# Patient Record
Sex: Female | Born: 1990 | Race: Black or African American | Hispanic: No | Marital: Single | Smoking: Current every day smoker
Health system: Southern US, Community
[De-identification: ages and names within clinical notes are randomized; demographics above are authoritative.]

## PROBLEM LIST (undated history)

## (undated) ENCOUNTER — Inpatient Hospital Stay (HOSPITAL_COMMUNITY): Payer: Self-pay

## (undated) ENCOUNTER — Emergency Department (HOSPITAL_COMMUNITY): Admission: EM | Payer: Medicaid Other | Source: Home / Self Care

## (undated) DIAGNOSIS — T7840XA Allergy, unspecified, initial encounter: Secondary | ICD-10-CM

## (undated) DIAGNOSIS — R569 Unspecified convulsions: Secondary | ICD-10-CM

## (undated) DIAGNOSIS — J301 Allergic rhinitis due to pollen: Secondary | ICD-10-CM

## (undated) DIAGNOSIS — Z87898 Personal history of other specified conditions: Secondary | ICD-10-CM

## (undated) DIAGNOSIS — Z8669 Personal history of other diseases of the nervous system and sense organs: Secondary | ICD-10-CM

## (undated) DIAGNOSIS — F32A Depression, unspecified: Secondary | ICD-10-CM

## (undated) DIAGNOSIS — E236 Other disorders of pituitary gland: Secondary | ICD-10-CM

## (undated) DIAGNOSIS — IMO0002 Reserved for concepts with insufficient information to code with codable children: Secondary | ICD-10-CM

## (undated) DIAGNOSIS — F329 Major depressive disorder, single episode, unspecified: Secondary | ICD-10-CM

## (undated) DIAGNOSIS — G43909 Migraine, unspecified, not intractable, without status migrainosus: Secondary | ICD-10-CM

## (undated) HISTORY — DX: Allergic rhinitis due to pollen: J30.1

## (undated) HISTORY — DX: Major depressive disorder, single episode, unspecified: F32.9

## (undated) HISTORY — DX: Allergy, unspecified, initial encounter: T78.40XA

## (undated) HISTORY — DX: Migraine, unspecified, not intractable, without status migrainosus: G43.909

## (undated) HISTORY — DX: Reserved for concepts with insufficient information to code with codable children: IMO0002

## (undated) HISTORY — DX: Depression, unspecified: F32.A

## (undated) HISTORY — PX: APPENDECTOMY: SHX54

## (undated) HISTORY — DX: Personal history of other diseases of the nervous system and sense organs: Z86.69

---
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
## 2012-03-03 ENCOUNTER — Other Ambulatory Visit: Payer: Self-pay | Admitting: Family Medicine

## 2012-03-05 NOTE — Telephone Encounter (Signed)
Electronic refill request.  Please advise. 

## 2012-03-06 NOTE — Telephone Encounter (Signed)
Clarify what dose pt no and fix med list. Refill until she makes an appt for yearly visit.

## 2012-03-07 NOTE — Telephone Encounter (Signed)
Spoke with father, pt no longer lives her, she moved back to [place].  Medicine denied to pharmacy.

## 2012-03-08 ENCOUNTER — Other Ambulatory Visit: Payer: Self-pay | Admitting: *Deleted

## 2012-03-09 MED ORDER — SERTRALINE HCL 50 MG PO TABS: 50.0000 mg | ORAL_TABLET | Freq: Every day | ORAL | Status: DC

## 2012-03-09 NOTE — Telephone Encounter (Signed)
Needs to make appt for CPX/ med follow up

## 2012-03-09 NOTE — Telephone Encounter (Signed)
Left message on VM that patient needs to schedule CPX

## 2012-12-19 ENCOUNTER — Telehealth: Payer: Self-pay

## 2012-12-19 NOTE — Telephone Encounter (Signed)
Pt left v/m that she is entering [employer] and needs a note that her eczema will not spread when she perspires.Please advise.

## 2012-12-20 ENCOUNTER — Encounter: Payer: Self-pay | Admitting: *Deleted

## 2012-12-20 NOTE — Telephone Encounter (Signed)
On your desk. Signed.

## 2012-12-20 NOTE — Telephone Encounter (Signed)
Letter sent to you for edit

## 2012-12-20 NOTE — Telephone Encounter (Signed)
Agreed. Please write note and I will sign.

## 2012-12-20 NOTE — Telephone Encounter (Signed)
Letter mailed to patient per patient request.

## 2013-03-19 ENCOUNTER — Emergency Department (INDEPENDENT_AMBULATORY_CARE_PROVIDER_SITE_OTHER)
Admission: EM | Admit: 2013-03-19 | Discharge: 2013-03-19 | Disposition: A | Discharge status: Home / Self Care | Payer: Self-pay | Source: Home / Self Care

## 2013-03-19 ENCOUNTER — Encounter (HOSPITAL_COMMUNITY): Payer: Self-pay | Admitting: Emergency Medicine

## 2013-03-19 DIAGNOSIS — N6452 Nipple discharge: Secondary | ICD-10-CM

## 2013-03-19 DIAGNOSIS — N6459 Other signs and symptoms in breast: Secondary | ICD-10-CM

## 2013-03-19 MED ORDER — CEPHALEXIN 500 MG PO CAPS: 500.0000 mg | ORAL_CAPSULE | Freq: Four times a day (QID) | ORAL | Status: DC

## 2013-03-19 NOTE — ED Provider Notes (Signed)
Medical screening examination/treatment/procedure(s) were performed by resident physician or non-physician practitioner and as supervising physician I was immediately available for consultation/collaboration.   Barkley Bruns MD.   Linna Hoff, MD 03/19/13 1415

## 2013-03-19 NOTE — ED Notes (Signed)
Pt c/o right nipple/breast d/c onset last night The d/c is blood and when she applies enough pressure it starts to bleed?? Sxs also include: itchiness and pain 5/10 Denies: fevers, inj/trauma Alert w/no signs of acute distress.

## 2013-03-19 NOTE — ED Provider Notes (Signed)
CSN: [id number]     Arrival date & time 03/19/13  1100 History   First MD Initiated Contact with Patient 03/19/13 1154     Chief Complaint  Patient presents with  . Breast Discharge   (Consider location/radiation/quality/duration/timing/severity/associated sxs/prior Treatment) HPI Comments: 22 year old female complaining of bleeding from the right nipple since last night. She noticed something wet on her breasts last night but down and saw there was a small amount of blood. She expressed her breast and noticed that a small amount would exude from the tip of the nipple. There is an area to the right side of the breast with minor tenderness. Otherwise she does not have pain. The left breast is completely unaffected and asymptomatic. She denies systemic symptoms.    Past Medical History  Diagnosis Date  . Allergy   . Depression   . Migraines    Past Surgical History  Procedure Laterality Date  . Appendectomy     Family History  Problem Relation Age of Onset  . Adopted: Yes   History  Substance Use Topics  . Smoking status: Never Smoker   . Smokeless tobacco: Not on file  . Alcohol Use: No   OB History   Grav Para Term Preterm Abortions TAB SAB Ect Mult Living                 Review of Systems  Constitutional: Negative.   HENT: Negative.   Respiratory: Negative.   Cardiovascular: Negative.   Gastrointestinal: Negative.   Genitourinary: Negative.   Skin: Negative.   Neurological: Negative.     Allergies  Review of patient's allergies indicates no known allergies.  Home Medications   Current Outpatient Rx  Name  Route  Sig  Dispense  Refill  . cephALEXin (KEFLEX) 500 MG capsule   Oral   Take 1 capsule (500 mg total) by mouth 4 (four) times daily.   28 capsule   0   . sertraline (ZOLOFT) 50 MG tablet   Oral   Take 1 tablet (50 mg total) by mouth daily.   30 tablet   1    BP 113/78  Pulse 63  Temp(Src) 98.9 F (37.2 C) (Oral)  Resp 18  SpO2 99%  LMP  02/23/2013 Physical Exam  Nursing note and vitals reviewed. Constitutional: She is oriented to person, place, and time. She appears well-developed and well-nourished. No distress.  Eyes: EOM are normal.  Neck: Normal range of motion. Neck supple.  Cardiovascular: Normal rate.   Pulmonary/Chest: Effort normal. No respiratory distress.  Breasts. Bilateral breast are  Symmetric. Her breast exam reveals no visible polyps bumps or anemia contours. Areola round, there is no discoloration. Palpation to the right lateral aspect of the breast produces mild tenderness. No palpable lumps or bumps or thickening. No thickening or cystic palpations beneath the areola. Manual expression of the breast near the nipple produces thin, scant bloody discharge.   Musculoskeletal: She exhibits no edema.  Neurological: She is alert and oriented to person, place, and time. She exhibits normal muscle tone.  Skin: Skin is warm and dry.  Psychiatric: She has a normal mood and affect.    ED Course  Procedures (including critical care time) Labs Review Labs Reviewed - No data to display Imaging Review No results found.    MDM   1. Bloody discharge from right nipple      Scant amount of discharge is produced with manipulation of the right breast near the Arreola. No purulence. Differential includes  ductal papilloma or memory.ectasia. Patient is to see her PCP and call today for followup appointment suspect she will need additional tests possibly cytology or ultrasound.  Culture sent.  Hayden Rasmussen, NP 03/19/13 1309

## 2013-03-19 NOTE — ED Notes (Signed)
At bedside during breast exam

## 2013-03-22 LAB — CULTURE, ROUTINE-ABSCESS

## 2013-03-22 NOTE — ED Notes (Addendum)
Abscess culture: Few staph (coagulase neg.).  Pt. adequately treated with Keflex. Candace Barnes 03/22/2013

## 2013-09-29 ENCOUNTER — Encounter (HOSPITAL_COMMUNITY): Payer: Self-pay | Admitting: Emergency Medicine

## 2013-09-29 ENCOUNTER — Emergency Department (HOSPITAL_COMMUNITY)
Admission: EM | Admit: 2013-09-29 | Discharge: 2013-09-29 | Disposition: A | Discharge status: Home / Self Care | Payer: BC Managed Care – PPO | Attending: Emergency Medicine | Admitting: Emergency Medicine

## 2013-09-29 DIAGNOSIS — Z8639 Personal history of other endocrine, nutritional and metabolic disease: Secondary | ICD-10-CM | POA: Insufficient documentation

## 2013-09-29 DIAGNOSIS — R519 Headache, unspecified: Secondary | ICD-10-CM

## 2013-09-29 DIAGNOSIS — R51 Headache: Secondary | ICD-10-CM | POA: Insufficient documentation

## 2013-09-29 DIAGNOSIS — Z8659 Personal history of other mental and behavioral disorders: Secondary | ICD-10-CM | POA: Insufficient documentation

## 2013-09-29 DIAGNOSIS — Z8679 Personal history of other diseases of the circulatory system: Secondary | ICD-10-CM | POA: Insufficient documentation

## 2013-09-29 DIAGNOSIS — Z862 Personal history of diseases of the blood and blood-forming organs and certain disorders involving the immune mechanism: Secondary | ICD-10-CM | POA: Insufficient documentation

## 2013-09-29 LAB — POC URINE PREG, ED: Preg Test, Ur: NEGATIVE

## 2013-09-29 MED ORDER — KETOROLAC TROMETHAMINE 30 MG/ML IJ SOLN
30.0000 mg | Freq: Once | INTRAMUSCULAR | Status: AC
  Administered 2013-09-29: 30 mg via INTRAVENOUS
  Filled 2013-09-29: qty 1

## 2013-09-29 MED ORDER — SODIUM CHLORIDE 0.9 % IV BOLUS (SEPSIS)
1000.0000 mL | Freq: Once | INTRAVENOUS | Status: AC
Start: 2013-09-29 — End: 2013-09-29
  Administered 2013-09-29: 1000 mL via INTRAVENOUS

## 2013-09-29 MED ORDER — METOCLOPRAMIDE HCL 5 MG/ML IJ SOLN
10.0000 mg | Freq: Once | INTRAMUSCULAR | Status: AC
  Administered 2013-09-29: 10 mg via INTRAVENOUS
  Filled 2013-09-29: qty 2

## 2013-09-29 MED ORDER — DIPHENHYDRAMINE HCL 50 MG/ML IJ SOLN
25.0000 mg | Freq: Once | INTRAMUSCULAR | Status: AC
  Administered 2013-09-29: 25 mg via INTRAVENOUS
  Filled 2013-09-29: qty 1

## 2013-09-29 NOTE — ED Notes (Signed)
Pt. reports migraine headache onset this evening unrelieved by Ibuprofen , denies nausea/vomitting or blurred vision .

## 2013-09-29 NOTE — ED Provider Notes (Signed)
CSN: [id number]     Arrival date & time 09/29/13  1947 History   First MD Initiated Contact with Patient 09/29/13 2128     Chief Complaint  Patient presents with  . Migraine     (Consider location/radiation/quality/duration/timing/severity/associated sxs/prior Treatment) HPI  23 year old female with history of migraine who presents complaining of headache. 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 She was placed on a medication which she took for several months. While taking the medication these sharp pain ceased. She discontinued the medication on her own and subsequently report having recurrent sharp electric-like pain to the back of her head on a fairly regular occurrence. The pain sometimes induce headache. She is now endorse  acute onset of throbbing  headache to the left temporal region for the past 6 hours. Pain intensified but has slowly improved. However pain has not resolved despite taking 200 mg of ibuprofen. She denies having any fever, chills, double vision, nausea vomiting diarrhea, light or sound sensitivity, neck stiffness, rash, numbness or weakness. She request for another head MRI to assess her pituitary gland. She does have a primary care Dr. but has not seen for this recently.  Past Medical History  Diagnosis Date  . Allergy   . Depression   . Migraines    Past Surgical History  Procedure Laterality Date  . Appendectomy     Family History  Problem Relation Age of Onset  . Adopted: Yes   History  Substance Use Topics  . Smoking status: Never Smoker   . Smokeless tobacco: Not on file  . Alcohol Use: No   OB History   Grav Para Term Preterm Abortions TAB SAB Ect Mult Living                 Review of Systems  All other systems reviewed and are negative.     Allergies  Other  Home Medications   Prior to Admission medications    Medication Sig Start Date End Date Taking? Authorizing Provider  ibuprofen (ADVIL,MOTRIN) 200 MG tablet Take 200 mg by mouth every 6 (six) hours as needed for headache.   Yes Historical Provider, MD   BP 116/77  Pulse 68  Temp(Src) 99.1 F (37.3 C) (Oral)  Resp 14  Ht 6\' 1"  (1.854 m)  Wt 141 lb (63.957 kg)  BMI 18.61 kg/m2  SpO2 99% Physical Exam  Nursing note and vitals reviewed. Constitutional: She is oriented to person, place, and time. She appears well-developed and well-nourished. No distress.  HENT:  Head: Normocephalic and atraumatic.  Right Ear: External ear normal.  Left Ear: External ear normal.  Nose: Nose normal.  Mouth/Throat: Oropharynx is clear and moist. No oropharyngeal exudate.  Eyes: Conjunctivae and EOM are normal. Pupils are equal, round, and reactive to light.  Neck: Normal range of motion. Neck supple. No thyromegaly present.  No nuchal rigidity  Cardiovascular: Normal rate and regular rhythm.   Pulmonary/Chest: Effort normal and breath sounds normal.  Musculoskeletal: Normal range of motion. She exhibits no edema.  Lymphadenopathy:    She has no cervical adenopathy.  Neurological: She is alert and oriented to person, place, and time. She has normal strength. No cranial nerve deficit or sensory deficit. She displays a negative Romberg sign. Coordination and gait normal. GCS eye subscore is 4. GCS verbal subscore is 5. GCS motor subscore is 6.  Skin: No rash noted.  Psychiatric: She has a normal mood and affect.    ED Course  Procedures (including critical care time)  9:41 PM Patient here with complaint of headache. The primary reason to be seen was to have an MRI to assess for pituitary gland. She has no red flags. Have low suspicion for subarachnoid hemorrhage, stroke, or meningitis. She has no loss of vision numbness or weakness to suggest acute pituitary problem. She may benefit from an MRI outpatient to her PCP if deemed appropriate. Plan to give  patient migraine cocktail and will continue to monitor.  11:49 PM Patient report her headache has completely resolved. She feels comfortable going Home. She will followup with her PCP for further management. Return precautions discussed.    Labs Review Labs Reviewed - No data to display  Imaging Review No results found.   EKG Interpretation None      MDM   Final diagnoses:  Headache    BP 101/50  Pulse 69  Temp(Src) 99.1 F (37.3 C) (Oral)  Resp 14  Ht 6\' 1"  (1.854 m)  Wt 141 lb (63.957 kg)  BMI 18.61 kg/m2  SpO2 99%     Domenic Moras, PA-C 09/29/13 2350

## 2013-09-29 NOTE — Discharge Instructions (Signed)

## 2013-09-30 NOTE — ED Provider Notes (Signed)
Medical screening examination/treatment/procedure(s) were performed by non-physician practitioner and as supervising physician I was immediately available for consultation/collaboration.   EKG Interpretation None      Rolland Porter, MD, Abram Sander   Janice Norrie, MD 09/30/13 [phone]

## 2013-11-01 ENCOUNTER — Emergency Department: Payer: Self-pay | Admitting: Emergency Medicine

## 2013-11-01 LAB — URINALYSIS, COMPLETE
Bilirubin,UR: NEGATIVE
GLUCOSE, UR: NEGATIVE mg/dL (ref 0–75)
Ketone: NEGATIVE
Nitrite: POSITIVE
Ph: 7 (ref 4.5–8.0)
Protein: 30
SPECIFIC GRAVITY: 1.02 (ref 1.003–1.030)
Squamous Epithelial: 7
WBC UR: 49 /HPF (ref 0–5)

## 2013-11-01 LAB — BASIC METABOLIC PANEL
Anion Gap: 1 — ABNORMAL LOW (ref 7–16)
BUN: 15 mg/dL (ref 7–18)
CO2: 29 mmol/L (ref 21–32)
Calcium, Total: 9 mg/dL (ref 8.5–10.1)
Chloride: 106 mmol/L (ref 98–107)
Creatinine: 0.89 mg/dL (ref 0.60–1.30)
EGFR (Non-African Amer.): >60
Glucose: 87 mg/dL (ref 65–99)
OSMOLALITY: 272 (ref 275–301)
Potassium: 3.9 mmol/L (ref 3.5–5.1)
SODIUM: 136 mmol/L (ref 136–145)

## 2013-11-01 LAB — CBC WITH DIFFERENTIAL/PLATELET
Basophil #: 0 10*3/uL (ref 0.0–0.1)
Basophil %: 0.8 %
Eosinophil #: 0.2 10*3/uL (ref 0.0–0.7)
Eosinophil %: 4.1 %
HCT: 36.8 % (ref 35.0–47.0)
HGB: 11.2 g/dL — ABNORMAL LOW (ref 12.0–16.0)
LYMPHS ABS: 2.1 10*3/uL (ref 1.0–3.6)
LYMPHS PCT: 54.4 %
MCH: 19.9 pg — ABNORMAL LOW (ref 26.0–34.0)
MCHC: 30.6 g/dL — ABNORMAL LOW (ref 32.0–36.0)
MCV: 65 fL — ABNORMAL LOW (ref 80–100)
Monocyte #: 0.3 x10 3/mm (ref 0.2–0.9)
Monocyte %: 8.2 %
Neutrophil #: 1.3 10*3/uL — ABNORMAL LOW (ref 1.4–6.5)
Neutrophil %: 32.5 %
PLATELETS: 238 10*3/uL (ref 150–440)
RBC: 5.65 10*6/uL — ABNORMAL HIGH (ref 3.80–5.20)
RDW: 21.2 % — ABNORMAL HIGH (ref 11.5–14.5)
WBC: 3.9 10*3/uL (ref 3.6–11.0)

## 2014-02-14 ENCOUNTER — Emergency Department (HOSPITAL_COMMUNITY): Admission: EM | Admit: 2014-02-14 | Discharge: 2014-02-14 | Discharge status: Against Medical Advice | Payer: Self-pay
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
## 2014-05-03 ENCOUNTER — Encounter (HOSPITAL_COMMUNITY): Payer: Self-pay | Admitting: *Deleted

## 2014-05-03 ENCOUNTER — Emergency Department (HOSPITAL_COMMUNITY)
Admission: EM | Admit: 2014-05-03 | Discharge: 2014-05-03 | Disposition: A | Discharge status: Home / Self Care | Payer: BC Managed Care – PPO | Attending: Emergency Medicine | Admitting: Emergency Medicine

## 2014-05-03 DIAGNOSIS — Z8659 Personal history of other mental and behavioral disorders: Secondary | ICD-10-CM | POA: Insufficient documentation

## 2014-05-03 DIAGNOSIS — H9209 Otalgia, unspecified ear: Secondary | ICD-10-CM | POA: Insufficient documentation

## 2014-05-03 DIAGNOSIS — J0100 Acute maxillary sinusitis, unspecified: Secondary | ICD-10-CM | POA: Insufficient documentation

## 2014-05-03 DIAGNOSIS — Z8679 Personal history of other diseases of the circulatory system: Secondary | ICD-10-CM | POA: Insufficient documentation

## 2014-05-03 MED ORDER — TRIAMCINOLONE ACETONIDE 55 MCG/ACT NA AERO: 2.0000 | INHALATION_SPRAY | Freq: Every day | NASAL | Status: DC

## 2014-05-03 NOTE — Discharge Instructions (Signed)
RECOMMEND ZYRTEC OR CLARITIN OVER-THE-COUNTER FOR FURTHER RELIEF OF SYMPTOMS OF SINUS SWELLING. TYLENOL AND/OR IBUPROFEN AS NEEDED FOR ACHES OR IF ANY FEVER DEVELOPS. RETURN HERE WITH ANY WORSENING SYMPTOMS OR NEW CONCERNS.   Sinusitis Sinusitis is redness, soreness, and inflammation of the paranasal sinuses. Paranasal sinuses are air pockets within the bones of your face (beneath the eyes, the middle of the forehead, or above the eyes). In healthy paranasal sinuses, mucus is able to drain out, and air is able to circulate through them by way of your nose. However, when your paranasal sinuses are inflamed, mucus and air can become trapped. This can allow bacteria and other germs to grow and cause infection. Sinusitis can develop quickly and last only a short time (acute) or continue over a long period (chronic). Sinusitis that lasts for more than 12 weeks is considered chronic.  CAUSES  Causes of sinusitis include:  Allergies.  Structural abnormalities, such as displacement of the cartilage that separates your nostrils (deviated septum), which can decrease the air flow through your nose and sinuses and affect sinus drainage.  Functional abnormalities, such as when the small hairs (cilia) that line your sinuses and help remove mucus do not work properly or are not present. SIGNS AND SYMPTOMS  Symptoms of acute and chronic sinusitis are the same. The primary symptoms are pain and pressure around the affected sinuses. Other symptoms include:  Upper toothache.  Earache.  Headache.  Bad breath.  Decreased sense of smell and taste.  A cough, which worsens when you are lying flat.  Fatigue.  Fever.  Thick drainage from your nose, which often is green and may contain pus (purulent).  Swelling and warmth over the affected sinuses. DIAGNOSIS  Your health care provider will perform a physical exam. During the exam, your health care provider may:  Look in your nose for signs of abnormal  growths in your nostrils (nasal polyps).  Tap over the affected sinus to check for signs of infection.  View the inside of your sinuses (endoscopy) using an imaging device that has a light attached (endoscope). If your health care provider suspects that you have chronic sinusitis, one or more of the following tests may be recommended:  Allergy tests.  Nasal culture. A sample of mucus is taken from your nose, sent to a lab, and screened for bacteria.  Nasal cytology. A sample of mucus is taken from your nose and examined by your health care provider to determine if your sinusitis is related to an allergy. TREATMENT  Most cases of acute sinusitis are related to a viral infection and will resolve on their own within 10 days. Sometimes medicines are prescribed to help relieve symptoms (pain medicine, decongestants, nasal steroid sprays, or saline sprays).  However, for sinusitis related to a bacterial infection, your health care provider will prescribe antibiotic medicines. These are medicines that will help kill the bacteria causing the infection.  Rarely, sinusitis is caused by a fungal infection. In theses cases, your health care provider will prescribe antifungal medicine. For some cases of chronic sinusitis, surgery is needed. Generally, these are cases in which sinusitis recurs more than 3 times per year, despite other treatments. HOME CARE INSTRUCTIONS   Drink plenty of water. Water helps thin the mucus so your sinuses can drain more easily.  Use a humidifier.  Inhale steam 3 to 4 times a day (for example, sit in the bathroom with the shower running).  Apply a warm, moist washcloth to your face 3 to  4 times a day, or as directed by your health care provider.  Use saline nasal sprays to help moisten and clean your sinuses.  Take medicines only as directed by your health care provider.  If you were prescribed either an antibiotic or antifungal medicine, finish it all even if you start  to feel better. SEEK IMMEDIATE MEDICAL CARE IF:  You have increasing pain or severe headaches.  You have nausea, vomiting, or drowsiness.  You have swelling around your face.  You have vision problems.  You have a stiff neck.  You have difficulty breathing. MAKE SURE YOU:   Understand these instructions.  Will watch your condition.  Will get help right away if you are not doing well or get worse. Document Released: 05/23/2005 Document Revised: 10/07/2013 Document Reviewed: 06/07/2011 [facility] Patient Information 2015 [place], [place]. This information is not intended to replace advice given to you by your health care provider. Make sure you discuss any questions you have with your health care provider.

## 2014-05-03 NOTE — ED Notes (Signed)
For a few weeks she has been having intermittent nosebleeds with pinching sensation in her head.  No nosebleed at present lmp   2 weeks

## 2014-05-03 NOTE — ED Provider Notes (Signed)
CSN: [id number]     Arrival date & time 05/03/14  0012 History   First MD Initiated Contact with Patient 05/03/14 0100     Chief Complaint  Patient presents with  . Epistaxis     (Consider location/radiation/quality/duration/timing/severity/associated sxs/prior Treatment) Patient is a 23 y.o. female presenting with nosebleeds. The history is provided by the patient. No language interpreter was used.  Epistaxis Location:  R nare Severity:  Mild Duration:  2 days Timing:  Intermittent Chronicity:  New Associated symptoms: congestion   Associated symptoms: no cough, no fever and no sore throat   Associated symptoms comment:  She is having an intermittent nosebleed, nasal congestion, right ear pain with bleeding and myalgias. No N, V, D. She denies sore throat or cough. She has a sharp tingling sensation in her posterior neck as well without radicular pattern symptoms.   Past Medical History  Diagnosis Date  . Allergy   . Depression   . Migraines    Past Surgical History  Procedure Laterality Date  . Appendectomy     Family History  Problem Relation Age of Onset  . Adopted: Yes   History  Substance Use Topics  . Smoking status: Never Smoker   . Smokeless tobacco: Not on file  . Alcohol Use: No   OB History    No data available     Review of Systems  Constitutional: Positive for chills. Negative for fever.  HENT: Positive for congestion, ear pain and nosebleeds. Negative for sore throat.   Eyes: Negative for visual disturbance.  Respiratory: Negative.  Negative for cough and shortness of breath.   Cardiovascular: Negative.   Gastrointestinal: Negative.  Negative for nausea and vomiting.  Musculoskeletal: Positive for myalgias. Negative for neck stiffness.  Skin: Negative.  Negative for rash.  Neurological: Negative.  Negative for syncope and weakness.      Allergies  Other  Home Medications   Prior to Admission medications   Medication Sig Start Date End  Date Taking? Authorizing Provider  ibuprofen (ADVIL,MOTRIN) 200 MG tablet Take 200 mg by mouth every 6 (six) hours as needed for headache.   Yes Historical Provider, MD   BP 128/86 mmHg  Pulse 79  Temp(Src) 98.8 F (37.1 C)  Resp 18  Ht 6\' 1"  (1.854 m)  Wt 155 lb (70.308 kg)  BMI 20.45 kg/m2  SpO2 99%  LMP 04/19/2014 Physical Exam  Constitutional: She is oriented to person, place, and time. She appears well-developed and well-nourished.  HENT:  Head: Normocephalic.  Small foci of bleed to right anterior septum without current bleed. Nasal mucosal edema bilaterally. TM's clear bilaterally, no visualized bleeding in external canals.   Eyes: Conjunctivae are normal.  Neck: Normal range of motion. Neck supple.  Cardiovascular: Normal rate and regular rhythm.   Pulmonary/Chest: Effort normal and breath sounds normal. She has no wheezes. She has no rales.  Abdominal: Soft. Bowel sounds are normal. There is no tenderness. There is no rebound and no guarding.  Musculoskeletal: Normal range of motion.  Lymphadenopathy:       Head (right side): No preauricular and no posterior auricular adenopathy present.       Head (left side): No preauricular and no posterior auricular adenopathy present.    She has no cervical adenopathy.  Neurological: She is alert and oriented to person, place, and time.  Skin: Skin is warm and dry. No rash noted.  Psychiatric: She has a normal mood and affect.    ED Course  Procedures (  including critical care time) Labs Review Labs Reviewed - No data to display  Imaging Review No results found.   EKG Interpretation None      MDM   Final diagnoses:  None    1. Sinusitis   Supportive care (steroid nasal spray, tylenol/ibuprofen, antihistamines) recommended. Follow up with PCP if symptoms persist.   Dewaine Oats, PA-C 05/03/14 [id number]  Ernestina Patches, MD 05/05/14 2133

## 2014-06-10 ENCOUNTER — Emergency Department (HOSPITAL_COMMUNITY): Payer: Self-pay

## 2014-06-10 ENCOUNTER — Encounter (HOSPITAL_COMMUNITY): Payer: Self-pay | Admitting: Emergency Medicine

## 2014-06-10 ENCOUNTER — Emergency Department (HOSPITAL_COMMUNITY)
Admission: EM | Admit: 2014-06-10 | Discharge: 2014-06-10 | Disposition: A | Discharge status: Home / Self Care | Payer: Self-pay | Attending: Emergency Medicine | Admitting: Emergency Medicine

## 2014-06-10 DIAGNOSIS — M545 Low back pain: Secondary | ICD-10-CM | POA: Insufficient documentation

## 2014-06-10 DIAGNOSIS — Z9049 Acquired absence of other specified parts of digestive tract: Secondary | ICD-10-CM | POA: Insufficient documentation

## 2014-06-10 DIAGNOSIS — Z87440 Personal history of urinary (tract) infections: Secondary | ICD-10-CM | POA: Insufficient documentation

## 2014-06-10 DIAGNOSIS — R1013 Epigastric pain: Secondary | ICD-10-CM

## 2014-06-10 DIAGNOSIS — R569 Unspecified convulsions: Secondary | ICD-10-CM | POA: Insufficient documentation

## 2014-06-10 DIAGNOSIS — Z720 Tobacco use: Secondary | ICD-10-CM | POA: Insufficient documentation

## 2014-06-10 DIAGNOSIS — Z3202 Encounter for pregnancy test, result negative: Secondary | ICD-10-CM | POA: Insufficient documentation

## 2014-06-10 DIAGNOSIS — K2970 Gastritis, unspecified, without bleeding: Secondary | ICD-10-CM

## 2014-06-10 DIAGNOSIS — N390 Urinary tract infection, site not specified: Secondary | ICD-10-CM | POA: Insufficient documentation

## 2014-06-10 DIAGNOSIS — R112 Nausea with vomiting, unspecified: Secondary | ICD-10-CM

## 2014-06-10 DIAGNOSIS — N939 Abnormal uterine and vaginal bleeding, unspecified: Secondary | ICD-10-CM | POA: Insufficient documentation

## 2014-06-10 HISTORY — DX: Unspecified convulsions: R56.9

## 2014-06-10 LAB — CBC WITH DIFFERENTIAL/PLATELET
BASOS ABS: 0 10*3/uL (ref 0.0–0.1)
Basophils Relative: 0 % (ref 0–1)
EOS PCT: 1 % (ref 0–5)
Eosinophils Absolute: 0.1 10*3/uL (ref 0.0–0.7)
HCT: 32.6 % — ABNORMAL LOW (ref 36.0–46.0)
HEMOGLOBIN: 10.2 g/dL — AB (ref 12.0–15.0)
LYMPHS ABS: 2.6 10*3/uL (ref 0.7–4.0)
LYMPHS PCT: 37 % (ref 12–46)
MCH: 20.2 pg — AB (ref 26.0–34.0)
MCHC: 31.3 g/dL (ref 30.0–36.0)
MCV: 64.7 fL — ABNORMAL LOW (ref 78.0–100.0)
MONOS PCT: 8 % (ref 3–12)
Monocytes Absolute: 0.6 10*3/uL (ref 0.1–1.0)
NEUTROS PCT: 54 % (ref 43–77)
Neutro Abs: 3.7 10*3/uL (ref 1.7–7.7)
PLATELETS: 262 10*3/uL (ref 150–400)
RBC: 5.04 MIL/uL (ref 3.87–5.11)
RDW: 18.7 % — ABNORMAL HIGH (ref 11.5–15.5)
WBC: 7 10*3/uL (ref 4.0–10.5)

## 2014-06-10 LAB — URINALYSIS, ROUTINE W REFLEX MICROSCOPIC
Bilirubin Urine: NEGATIVE
GLUCOSE, UA: NEGATIVE mg/dL
Ketones, ur: NEGATIVE mg/dL
LEUKOCYTES UA: NEGATIVE
NITRITE: POSITIVE — AB
PH: 6.5 (ref 5.0–8.0)
Protein, ur: NEGATIVE mg/dL
SPECIFIC GRAVITY, URINE: 1.026 (ref 1.005–1.030)
Urobilinogen, UA: 1 mg/dL (ref 0.0–1.0)

## 2014-06-10 LAB — CBG MONITORING, ED: Glucose-Capillary: 74 mg/dL (ref 70–99)

## 2014-06-10 LAB — URINE MICROSCOPIC-ADD ON

## 2014-06-10 LAB — WET PREP, GENITAL
TRICH WET PREP: NONE SEEN
Yeast Wet Prep HPF POC: NONE SEEN

## 2014-06-10 LAB — I-STAT CHEM 8, ED
BUN: 20 mg/dL (ref 6–23)
CALCIUM ION: 1.16 mmol/L (ref 1.12–1.23)
CREATININE: 1 mg/dL (ref 0.50–1.10)
Chloride: 102 mEq/L (ref 96–112)
Glucose, Bld: 89 mg/dL (ref 70–99)
HCT: 40 % (ref 36.0–46.0)
Hemoglobin: 13.6 g/dL (ref 12.0–15.0)
Potassium: 3.6 mmol/L (ref 3.5–5.1)
Sodium: 139 mmol/L (ref 135–145)
TCO2: 21 mmol/L (ref 0–100)

## 2014-06-10 LAB — RAPID URINE DRUG SCREEN, HOSP PERFORMED
AMPHETAMINES: NOT DETECTED
BENZODIAZEPINES: NOT DETECTED
Barbiturates: NOT DETECTED
COCAINE: NOT DETECTED
OPIATES: NOT DETECTED
Tetrahydrocannabinol: NOT DETECTED

## 2014-06-10 LAB — I-STAT CG4 LACTIC ACID, ED: LACTIC ACID, VENOUS: 0.57 mmol/L (ref 0.5–2.2)

## 2014-06-10 LAB — PREGNANCY, URINE: Preg Test, Ur: NEGATIVE

## 2014-06-10 LAB — TSH: TSH: 2.238 u[IU]/mL (ref 0.350–4.500)

## 2014-06-10 LAB — T4, FREE: Free T4: 1.27 ng/dL (ref 0.80–1.80)

## 2014-06-10 MED ORDER — GI COCKTAIL ~~LOC~~
30.0000 mL | Freq: Once | ORAL | Status: AC
  Administered 2014-06-10: 30 mL via ORAL
  Filled 2014-06-10: qty 30

## 2014-06-10 MED ORDER — CEPHALEXIN 500 MG PO CAPS
500.0000 mg | ORAL_CAPSULE | Freq: Once | ORAL | Status: AC
  Administered 2014-06-10: 500 mg via ORAL
  Filled 2014-06-10: qty 1

## 2014-06-10 MED ORDER — PROMETHAZINE HCL 25 MG PO TABS: 25.0000 mg | ORAL_TABLET | Freq: Four times a day (QID) | ORAL | Status: DC | PRN

## 2014-06-10 MED ORDER — ONDANSETRON HCL 4 MG/2ML IJ SOLN
4.0000 mg | Freq: Once | INTRAMUSCULAR | Status: AC
  Administered 2014-06-10: 4 mg via INTRAVENOUS
  Filled 2014-06-10: qty 2

## 2014-06-10 MED ORDER — PANTOPRAZOLE SODIUM 40 MG IV SOLR
40.0000 mg | Freq: Once | INTRAVENOUS | Status: AC
  Administered 2014-06-10: 40 mg via INTRAVENOUS
  Filled 2014-06-10: qty 40

## 2014-06-10 MED ORDER — CEPHALEXIN 500 MG PO CAPS: 500.0000 mg | ORAL_CAPSULE | Freq: Two times a day (BID) | ORAL | Status: DC

## 2014-06-10 MED ORDER — SODIUM CHLORIDE 0.9 % IV BOLUS (SEPSIS)
1000.0000 mL | Freq: Once | INTRAVENOUS | Status: AC
  Administered 2014-06-10: 1000 mL via INTRAVENOUS

## 2014-06-10 MED ORDER — LEVETIRACETAM 500 MG PO TABS
500.0000 mg | ORAL_TABLET | Freq: Once | ORAL | Status: AC
  Administered 2014-06-10: 500 mg via ORAL
  Filled 2014-06-10 (×2): qty 1

## 2014-06-10 MED ORDER — CEFTRIAXONE SODIUM 1 G IJ SOLR
1.0000 g | Freq: Once | INTRAMUSCULAR | Status: AC
  Administered 2014-06-10: 1 g via INTRAVENOUS
  Filled 2014-06-10: qty 10

## 2014-06-10 MED ORDER — LEVETIRACETAM IN NACL 1000 MG/100ML IV SOLN
1000.0000 mg | Freq: Once | INTRAVENOUS | Status: AC
  Administered 2014-06-10: 1000 mg via INTRAVENOUS
  Filled 2014-06-10: qty 100

## 2014-06-10 MED ORDER — SUCRALFATE 1 G PO TABS: 1.0000 g | ORAL_TABLET | Freq: Three times a day (TID) | ORAL | Status: DC

## 2014-06-10 MED ORDER — LEVETIRACETAM 500 MG PO TABS: 500.0000 mg | ORAL_TABLET | Freq: Two times a day (BID) | ORAL | Status: DC

## 2014-06-10 NOTE — ED Notes (Signed)
PT aware waiting for Keppra from pharmacy

## 2014-06-10 NOTE — Discharge Instructions (Signed)
Urinary Tract Infection Candace Barnes, you were seen today for a seizure. You may have a seizure disorder. Take Keppra medication as prescribed and follow-up with neurology within 3 days for continued management. You also have a urine infection which may have caused your seizure tonight. Take antibiotics as prescribed. If any symptoms worsen come back to emergency department immediately. Thank you. A urinary tract infection (UTI) can occur any place along the urinary tract. The tract includes the kidneys, ureters, bladder, and urethra. A type of germ called bacteria often causes a UTI. UTIs are often helped with antibiotic medicine.  HOME CARE   If given, take antibiotics as told by your doctor. Finish them even if you start to feel better.  Drink enough fluids to keep your pee (urine) clear or pale yellow.  Avoid tea, drinks with caffeine, and bubbly (carbonated) drinks.  Pee often. Avoid holding your pee in for a long time.  Pee before and after having sex (intercourse).  Wipe from front to back after you poop (bowel movement) if you are a woman. Use each tissue only once. GET HELP RIGHT AWAY IF:   You have back pain.  You have lower belly (abdominal) pain.  You have chills.  You feel sick to your stomach (nauseous).  You throw up (vomit).  Your burning or discomfort with peeing does not go away.  You have a fever.  Your symptoms are not better in 3 days. MAKE SURE YOU:   Understand these instructions.  Will watch your condition.  Will get help right away if you are not doing well or get worse. Document Released: 11/09/2007 Document Revised: 02/15/2012 Document Reviewed: 12/22/2011 [facility] Patient Information 2015 [place], [place]. This information is not intended to replace advice given to you by your health care provider. Make sure you discuss any questions you have with your health care provider. Seizure, Adult A seizure means there is unusual activity in the brain. A  seizure can cause changes in attention or behavior. Seizures often cause shaking (convulsions). Seizures often last from 30 seconds to 2 minutes. HOME CARE   If you are given medicines, take them exactly as told by your doctor.  Keep all doctor visits as told.  Do not swim or drive until your doctor says it is okay.  Teach others what to do if you have a seizure. They should:  Lay you on the ground.  Put a cushion under your head.  Loosen any tight clothing around your neck.  Turn you on your side.  Stay with you until you get better. GET HELP RIGHT AWAY IF:   The seizure lasts longer than 2 to 5 minutes.  The seizure is very bad.  The person does not wake up after the seizure.  The person's attention or behavior changes. Drive the person to the emergency room or call your local emergency services (911 in [place] MAKE SURE YOU:   Understand these instructions.  Will watch your condition.  Will get help right away if you are not doing well or get worse. Document Released: 11/09/2007 Document Revised: 08/15/2011 Document Reviewed: 05/11/2011 [facility] Patient Information 2015 [place], [place]. This information is not intended to replace advice given to you by your health care provider. Make sure you discuss any questions you have with your health care provider.
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
## 2014-06-10 NOTE — ED Provider Notes (Signed)
CSN: [id number]     Arrival date & time 06/10/14  0248 History   First MD Initiated Contact with Patient 06/10/14 0310     Chief Complaint  Patient presents with  . Seizures     (Consider location/radiation/quality/duration/timing/severity/associated sxs/prior Treatment) Patient is a 24 y.o. female presenting with seizures.  Seizures   Candace Barnes is a 24 y.o. female with past medical history of seizures by patient report presenting today with seizure-like activity. Patient is unable to give her own history due to confusion. Her boyfriend states that he woke up next to her with her entire body shaking. Afterwards he describes the patient being confused and slow to respond. He states she's had 2 seizures in the past but never sought medical care. She is currently denying pain anywhere. Denies any recent infections. She has had increased urinary frequency, with no dysuria. Boyfriend also noted traces of blood coming from nose and mouth during the seizure. Patient's denying biting her tongue currently. There is no incontinence. Boyfriend also states despite increasing her appetite, patient has been losing weight over the past couple of weeks. Boyfriend states no other concerns.   Past Medical History  Diagnosis Date  . Seizures    History reviewed. No pertinent past surgical history. History reviewed. No pertinent family history. History  Substance Use Topics  . Smoking status: Current Some Day Smoker    Types: Cigarettes  . Smokeless tobacco: Not on file  . Alcohol Use: No   OB History    No data available     Review of Systems  Unable to perform ROS: Mental status change  Neurological: Positive for seizures.      Allergies  Review of patient's allergies indicates no known allergies.  Home Medications   Prior to Admission medications   Not on File   BP 104/67 mmHg  Pulse 76  Temp(Src) 97.9 F (36.6 C) (Oral)  Resp 26  SpO2 100%  LMP 06/10/2014 Physical Exam   Constitutional: She is oriented to person, place, and time. She appears well-developed and well-nourished. No distress.  HENT:  Head: Normocephalic and atraumatic.  Nose: Nose normal.  Mouth/Throat: Oropharynx is clear and moist. No oropharyngeal exudate.  Eyes: Conjunctivae and EOM are normal. Pupils are equal, round, and reactive to light. No scleral icterus.  Neck: Normal range of motion. Neck supple. No JVD present. No tracheal deviation present. No thyromegaly present.  Cardiovascular: Normal rate, regular rhythm and normal heart sounds.  Exam reveals no gallop and no friction rub.   No murmur heard. Pulmonary/Chest: Effort normal and breath sounds normal. No respiratory distress. She has no wheezes. She exhibits no tenderness.  Abdominal: Soft. Bowel sounds are normal. She exhibits no distension and no mass. There is no tenderness. There is no rebound and no guarding.  Musculoskeletal: Normal range of motion. She exhibits no edema or tenderness.  Lymphadenopathy:    She has no cervical adenopathy.  Neurological: She is alert and oriented to person, place, and time. No cranial nerve deficit. She exhibits normal muscle tone.  Normal strength and sensation 4 extremities. Normal cerebellar testing. Gait was not assessed.  Skin: Skin is warm and dry. No rash noted. No erythema. No pallor.  Nursing note and vitals reviewed.   ED Course  Procedures (including critical care time) Labs Review Labs Reviewed  CBC WITH DIFFERENTIAL - Abnormal; Notable for the following:    Hemoglobin 10.2 (*)    HCT 32.6 (*)    MCV 64.7 (*)  MCH 20.2 (*)    RDW 18.7 (*)    All other components within normal limits  URINALYSIS, ROUTINE W REFLEX MICROSCOPIC - Abnormal; Notable for the following:    APPearance CLOUDY (*)    Hgb urine dipstick LARGE (*)    Nitrite POSITIVE (*)    All other components within normal limits  URINE MICROSCOPIC-ADD ON - Abnormal; Notable for the following:    Bacteria, UA  MANY (*)    All other components within normal limits  URINE CULTURE  PREGNANCY, URINE  URINE RAPID DRUG SCREEN (HOSP PERFORMED)  TSH  T4, FREE  I-STAT CHEM 8, ED  I-STAT CG4 LACTIC ACID, ED    Imaging Review Dg Chest 2 View  06/10/2014   CLINICAL DATA:  Seizures.  Patient is not felt well for a month.  EXAM: CHEST  2 VIEW  COMPARISON:  None.  FINDINGS: Normal heart size and pulmonary vascularity. No focal airspace disease or consolidation in the lungs. No blunting of costophrenic angles. No pneumothorax. Mediastinal contours appear intact. Thoracic scoliosis convex towards the right.  IMPRESSION: No active cardiopulmonary disease.   Electronically Signed   By: Lucienne Capers M.D.   On: 06/10/2014 04:21   Ct Head Wo Contrast  06/10/2014   CLINICAL DATA:  Seizure tonight. Pain for at least a month. Previous history of seizures.  EXAM: CT HEAD WITHOUT CONTRAST  TECHNIQUE: Contiguous axial images were obtained from the base of the skull through the vertex without intravenous contrast.  COMPARISON:  None.  FINDINGS: Ventricles and sulci appear symmetrical. No mass effect or midline shift. No abnormal extra-axial fluid collections. Gray-white matter junctions are distinct. Basal cisterns are not effaced. No evidence of acute intracranial hemorrhage. No depressed skull fractures. Visualized paranasal sinuses and mastoid air cells are not opacified.  IMPRESSION: No acute intracranial abnormalities.  Normal examination.   Electronically Signed   By: Lucienne Capers M.D.   On: 06/10/2014 04:08     EKG Interpretation   Date/Time:  Tuesday June 10 2014 03:54:18 EST Ventricular Rate:  69 PR Interval:  113 QRS Duration: 100 QT Interval:  411 QTC Calculation: 440 R Axis:   61 Text Interpretation:  Sinus rhythm Borderline short PR interval Confirmed  by Glynn Octave [phone]) on 06/10/2014 4:22:15 AM      MDM   Final diagnoses:  Seizure    Patient since emergency department for  possible seizure-like activity. Per the boyfriend and this is the third episode. Will obtain CT scan of the head as she has never had an evaluation for seizures. EKG does not show any signs this may have been a syncopal episode.  CT head is negative for any acute intracranial pathology.  Patient will be started on Keppra therapy, neurology follow-up was advised for continued management.  Urinalysis shows an infection. Patient has underlying seizure disorder, this infection could've set her off. This is consistent with the boyfriend stating she is had increased urinary frequency. She was given ceftriaxone emergency department and will be sent home with Keflex for treatment. Her vital signs remain within her normal limits and she is safe for discharge.    Everlene Balls, MD 06/10/14 [phone]

## 2014-06-10 NOTE — ED Notes (Signed)
Pt reports left flank pain and vomiting starting one hour post discharge. Pt diagnosed with UTI but did not get prescription filled.

## 2014-06-10 NOTE — ED Provider Notes (Signed)
CSN: [id number]     Arrival date & time 06/10/14  1612 History   First MD Initiated Contact with Patient 06/10/14 1627     Chief Complaint  Patient presents with  . Flank Pain     (Consider location/radiation/quality/duration/timing/severity/associated sxs/prior Treatment) Patient is a 24 y.o. female presenting with flank pain. The history is provided by the patient and medical records. No language interpreter was used.  Flank Pain Associated symptoms include abdominal pain (epigastric), nausea and vomiting. Pertinent negatives include no chest pain, diaphoresis, fatigue, fever, headaches or rash.     Candace Barnes is a 24 y.o. female  with a hx of seizures presents to the Emergency Department complaining of gradual, persistent, progressively worsening left flank pain and right lower quadrant abdominal pain with associated vomiting onset approximately 8 AM this morning. Patient reports she was evaluated last night for seizures. She reports she was discharged home after being told she had a urinary tract infection with several medications which she did not fill. She reports that she got home her left low back and right lower quadrant of her abdomen began to hurt and she subsequently had several episodes of nonbloody, nonbilious emesis.  She reports that at this time she also has epigastric abdominal pain described as a burning sensation. No treatments prior to arrival.  No aggravating or alleviating factors. Patient reports she has tried to drink orange juice which has exacerbated her vomiting. She denies fevers, chills, headache, neck pain, chest pain, shortness of breath, diarrhea, weakness, dizziness, syncope, dysuria, hematuria.   Past Medical History  Diagnosis Date  . Seizures    History reviewed. No pertinent past surgical history. No family history on file. History  Substance Use Topics  . Smoking status: Current Some Day Smoker    Types: Cigarettes  . Smokeless tobacco: Not on file   . Alcohol Use: No   OB History    No data available     Review of Systems  Constitutional: Negative for fever, diaphoresis, appetite change and fatigue.  Respiratory: Negative for shortness of breath.   Cardiovascular: Negative for chest pain.  Gastrointestinal: Positive for nausea, vomiting and abdominal pain (epigastric). Negative for diarrhea, constipation and blood in stool.  Genitourinary: Positive for frequency and flank pain ( left). Negative for dysuria, urgency, hematuria and difficulty urinating.  Musculoskeletal: Positive for back pain (left low back pain).  Skin: Negative for rash.  Neurological: Negative for headaches.  All other systems reviewed and are negative.     Allergies  Review of patient's allergies indicates no known allergies.  Home Medications   Prior to Admission medications   Medication Sig Start Date End Date Taking? Authorizing Provider  cephALEXin (KEFLEX) 500 MG capsule Take 1 capsule (500 mg total) by mouth 2 (two) times daily. 06/10/14  Yes Everlene Balls, MD  levETIRAcetam (KEPPRA) 500 MG tablet Take 1 tablet (500 mg total) by mouth 2 (two) times daily. 06/10/14  Yes Everlene Balls, MD  promethazine (PHENERGAN) 25 MG tablet Take 1 tablet (25 mg total) by mouth every 6 (six) hours as needed for nausea or vomiting. 06/10/14   Varun Jourdan, PA-C  sucralfate (CARAFATE) 1 G tablet Take 1 tablet (1 g total) by mouth 4 (four) times daily -  with meals and at bedtime. 06/10/14   Ketrick Matney, PA-C   BP 130/80 mmHg  Pulse 58  Temp(Src) 97.8 F (36.6 C) (Oral)  Resp 18  SpO2 100%  LMP 06/10/2014 Physical Exam  Constitutional: She appears well-developed  and well-nourished. No distress.  HENT:  Head: Normocephalic and atraumatic.  Mouth/Throat: Oropharynx is clear and moist. No oropharyngeal exudate.  Eyes: Conjunctivae are normal.  Neck: Normal range of motion. Neck supple.  Full ROM without pain  Cardiovascular: Normal rate, regular rhythm,  normal heart sounds and intact distal pulses.   No murmur heard. Pulmonary/Chest: Effort normal and breath sounds normal. No respiratory distress. She has no wheezes.  Abdominal: Soft. Bowel sounds are normal. She exhibits no distension and no mass. There is tenderness in the right lower quadrant. There is no rebound, no guarding and no CVA tenderness. Hernia confirmed negative in the right inguinal area and confirmed negative in the left inguinal area.  Abdomen is soft with mild tenderness to the right lower quadrant without rebound or peritoneal signs Well-healed surgical scar in the right lower quadrant from prior appendectomy No CVA tenderness  Genitourinary: Uterus normal. No labial fusion. There is no rash, tenderness or lesion on the right labia. There is no rash, tenderness or lesion on the left labia. Uterus is not deviated, not enlarged, not fixed and not tender. Cervix exhibits no motion tenderness, no discharge and no friability. Right adnexum displays no mass, no tenderness and no fullness. Left adnexum displays no mass, no tenderness and no fullness. There is bleeding in the vagina. No erythema or tenderness in the vagina. No foreign body around the vagina. No signs of injury around the vagina. No vaginal discharge found.  Blood in the vaginal vault  Musculoskeletal: Normal range of motion. She exhibits no edema.  Full range of motion of the T-spine and L-spine No tenderness to palpation of the spinous processes of the T-spine or L-spine Mild tenderness to palpation of the left paraspinous muscles of the L-spine  Lymphadenopathy:    She has no cervical adenopathy.       Right: No inguinal adenopathy present.       Left: No inguinal adenopathy present.  Neurological: She is alert. She has normal reflexes.  Reflex Scores:      Bicep reflexes are 2+ on the right side and 2+ on the left side.      Brachioradialis reflexes are 2+ on the right side and 2+ on the left side.      Patellar  reflexes are 2+ on the right side and 2+ on the left side.      Achilles reflexes are 2+ on the right side and 2+ on the left side. Speech is clear and goal oriented, follows commands Normal 5/5 strength in upper and lower extremities bilaterally including dorsiflexion and plantar flexion, strong and equal grip strength Sensation normal to light and sharp touch Moves extremities without ataxia, coordination intact Normal gait Normal balance No Clonus   Skin: Skin is warm and dry. No rash noted. She is not diaphoretic. No erythema.  Psychiatric: She has a normal mood and affect. Her behavior is normal.  Nursing note and vitals reviewed.   ED Course  Procedures (including critical care time) Labs Review Labs Reviewed  WET PREP, GENITAL - Abnormal; Notable for the following:    Clue Cells Wet Prep HPF POC FEW (*)    WBC, Wet Prep HPF POC MODERATE (*)    All other components within normal limits  GC/CHLAMYDIA PROBE AMP  CBG MONITORING, ED    Imaging Review Dg Chest 2 View  06/10/2014   CLINICAL DATA:  Seizures.  Patient is not felt well for a month.  EXAM: CHEST  2 VIEW  COMPARISON:  None.  FINDINGS: Normal heart size and pulmonary vascularity. No focal airspace disease or consolidation in the lungs. No blunting of costophrenic angles. No pneumothorax. Mediastinal contours appear intact. Thoracic scoliosis convex towards the right.  IMPRESSION: No active cardiopulmonary disease.   Electronically Signed   By: Lucienne Capers M.D.   On: 06/10/2014 04:21   Ct Head Wo Contrast  06/10/2014   CLINICAL DATA:  Seizure tonight. Pain for at least a month. Previous history of seizures.  EXAM: CT HEAD WITHOUT CONTRAST  TECHNIQUE: Contiguous axial images were obtained from the base of the skull through the vertex without intravenous contrast.  COMPARISON:  None.  FINDINGS: Ventricles and sulci appear symmetrical. No mass effect or midline shift. No abnormal extra-axial fluid collections. Gray-white  matter junctions are distinct. Basal cisterns are not effaced. No evidence of acute intracranial hemorrhage. No depressed skull fractures. Visualized paranasal sinuses and mastoid air cells are not opacified.  IMPRESSION: No acute intracranial abnormalities.  Normal examination.   Electronically Signed   By: Lucienne Capers M.D.   On: 06/10/2014 04:08     EKG Interpretation None      MDM   Final diagnoses:  Gastritis  Epigastric abdominal pain  Non-intractable vomiting with nausea, vomiting of unspecified type   Emylee Boxwell presents several hours after discharge for evaluation of seizures and diagnosis of urinary tract infection with right lower quadrant abdominal pain, left low back pain and complaint of vomiting. Emesis here in the emergency department. Labs taken this morning at approximately 3 AM are within normal limits. No indication for repeat lab work at this time. Patient without further seizure activity. Patient's pain is located in the right lower quadrant however she has a previous history of appendectomy.  Patient given Zofran here in the emergency department, she declines pain medication at this time. Will perform pelvic exam and reassess.  7:58 PM Pelvic exam without adnexal or cervical motion tenderness.  Pt reports that she continues to have mild epigastric abdominal pain but is somewhat improved after GI cocktail and Pepcid. Patient likely with gastritis secondary to multiple bouts of vomiting. Patient's vomiting is likely secondary to her urinary tract infection or a viral gastritis. Abdomen is soft and without rebound or peritoneal signs. She does not guard and complains of only mild pain.  Labs from last night reassuring.  Patient is nontoxic, nonseptic appearing, in no apparent distress.  Patient's pain and other symptoms adequately managed in emergency department.  Fluid bolus given.  Labs,and vitals from today and last night reviewed.  Patient does not meet the SIRS or  Sepsis criteria.  On repeat exam patient does not have a surgical abdomin and there are no peritoneal signs.  No indication of appendicitis, bowel obstruction, bowel perforation, cholecystitis, diverticulitis, PID or ectopic pregnancy.  Patient discharged home with symptomatic treatment   I have personally reviewed patient's vitals, nursing note and any pertinent labs or imaging.  I performed an undressed physical exam.    It has been determined that no acute conditions requiring further emergency intervention are present at this time. The patient/guardian have been advised of the diagnosis and plan. I reviewed all labs and imaging including any potential incidental findings. We have discussed signs and symptoms that warrant return to the ED and they are listed in the discharge instructions.    Vital signs are stable at discharge.   BP 130/80 mmHg  Pulse 58  Temp(Src) 97.8 F (36.6 C) (Oral)  Resp 18  SpO2 100%  LMP 06/10/2014        Abigail Butts, PA-C 06/10/14 2005  Evelina Bucy, MD 06/10/14 2337

## 2014-06-10 NOTE — ED Notes (Signed)
Per [place] NT CBG 74; pt given orange juice.

## 2014-06-10 NOTE — Discharge Instructions (Signed)
1. Medications: Phenergan, Carafate, usual home medications 2. Treatment: rest, drink plenty of fluids, advance diet slowly 3. Follow Up: Please followup with your primary doctor in 2 days for discussion of your diagnoses and further evaluation after today's visit; if you do not have a primary care doctor use the resource guide provided to find one; Please return to the ER for persistent vomiting, high fevers or worsening symptoms   Gastritis, Adult Gastritis is soreness and swelling (inflammation) of the lining of the stomach. Gastritis can develop as a sudden onset (acute) or long-term (chronic) condition. If gastritis is not treated, it can lead to stomach bleeding and ulcers. CAUSES  Gastritis occurs when the stomach lining is weak or damaged. Digestive juices from the stomach then inflame the weakened stomach lining. The stomach lining may be weak or damaged due to viral or bacterial infections. One common bacterial infection is the Helicobacter pylori infection. Gastritis can also result from excessive alcohol consumption, taking certain medicines, or having too much acid in the stomach.  SYMPTOMS  In some cases, there are no symptoms. When symptoms are present, they may include:  Pain or a burning sensation in the upper abdomen.  Nausea.  Vomiting.  An uncomfortable feeling of fullness after eating. DIAGNOSIS  Your caregiver may suspect you have gastritis based on your symptoms and a physical exam. To determine the cause of your gastritis, your caregiver may perform the following:  Blood or stool tests to check for the H pylori bacterium.  Gastroscopy. A thin, flexible tube (endoscope) is passed down the esophagus and into the stomach. The endoscope has a light and camera on the end. Your caregiver uses the endoscope to view the inside of the stomach.  Taking a tissue sample (biopsy) from the stomach to examine under a microscope. TREATMENT  Depending on the cause of your  gastritis, medicines may be prescribed. If you have a bacterial infection, such as an H pylori infection, antibiotics may be given. If your gastritis is caused by too much acid in the stomach, H2 blockers or antacids may be given. Your caregiver may recommend that you stop taking aspirin, ibuprofen, or other nonsteroidal anti-inflammatory drugs (NSAIDs). HOME CARE INSTRUCTIONS  Only take over-the-counter or prescription medicines as directed by your caregiver.  If you were given antibiotic medicines, take them as directed. Finish them even if you start to feel better.  Drink enough fluids to keep your urine clear or pale yellow.  Avoid foods and drinks that make your symptoms worse, such as:  Caffeine or alcoholic drinks.  Chocolate.  Peppermint or mint flavorings.  Garlic and onions.  Spicy foods.  Citrus fruits, such as oranges, lemons, or limes.  Tomato-based foods such as sauce, chili, salsa, and pizza.  Fried and fatty foods.  Eat small, frequent meals instead of large meals. SEEK IMMEDIATE MEDICAL CARE IF:   You have black or dark red stools.  You vomit blood or material that looks like coffee grounds.  You are unable to keep fluids down.  Your abdominal pain gets worse.  You have a fever.  You do not feel better after 1 week.  You have any other questions or concerns. MAKE SURE YOU:  Understand these instructions.  Will watch your condition.  Will get help right away if you are not doing well or get worse. Document Released: 05/17/2001 Document Revised: 11/22/2011 Document Reviewed: 07/06/2011 [facility] Patient Information 2015 [place], [place]. This information is not intended to replace advice given to you by  your health care provider. Make sure you discuss any questions you have with your health care provider.

## 2014-06-11 LAB — GC/CHLAMYDIA PROBE AMP
CT Probe RNA: NEGATIVE
GC Probe RNA: NEGATIVE

## 2014-06-12 ENCOUNTER — Telehealth (HOSPITAL_BASED_OUTPATIENT_CLINIC_OR_DEPARTMENT_OTHER): Payer: Self-pay | Admitting: Emergency Medicine

## 2014-06-12 LAB — URINE CULTURE

## 2014-06-12 NOTE — Telephone Encounter (Signed)
Post ED Visit - Positive Culture Follow-up  Culture report reviewed by antimicrobial stewardship pharmacist: []  [place], Pharm.D., BCPS []  Heide Guile, Pharm.D., BCPS [x]  Alycia Rossetti, Pharm.D., BCPS []  [place], Pharm.D., BCPS, AAHIVP []  Legrand Como, Pharm.D., BCPS, AAHIVP []  Isac Sarna, Pharm.D., BCPS  Positive urine culture E. Coli Treated with Cephalexin, organism sensitive to the same and no further patient follow-up is required at this time.  Hazle Nordmann 06/12/2014, 11:12 AM

## 2014-06-13 ENCOUNTER — Telehealth (HOSPITAL_COMMUNITY): Payer: Self-pay

## 2014-06-13 NOTE — Telephone Encounter (Signed)
Post ED Visit - Positive Culture Follow-up  Culture report reviewed by antimicrobial stewardship pharmacist: []  Wes Donnella Sham, Pharm.D., BCPS []  Heide Guile, Pharm.D., BCPS []  Alycia Rossetti, Pharm.D., BCPS []  [place], Pharm.D., BCPS, AAHIVP [x]  Legrand Como, Pharm.D., BCPS, AAHIVP []  Isac Sarna, Pharm.D., BCPS  Positive Urine culture, >/= 100,000 colonies -> E Coli Treated with Cephalexin, organism sensitive to the same and no further patient follow-up is required at this time.  Dortha Kern 06/13/2014, 12:07 PM

## 2014-06-18 ENCOUNTER — Encounter (HOSPITAL_COMMUNITY): Payer: Self-pay

## 2014-06-18 ENCOUNTER — Observation Stay (HOSPITAL_COMMUNITY)
Admission: EM | Admit: 2014-06-18 | Discharge: 2014-06-19 | Disposition: A | Discharge status: Home / Self Care | Payer: Self-pay | Attending: Internal Medicine | Admitting: Internal Medicine

## 2014-06-18 ENCOUNTER — Emergency Department (HOSPITAL_COMMUNITY): Payer: Self-pay

## 2014-06-18 DIAGNOSIS — Y929 Unspecified place or not applicable: Secondary | ICD-10-CM | POA: Insufficient documentation

## 2014-06-18 DIAGNOSIS — G43809 Other migraine, not intractable, without status migrainosus: Secondary | ICD-10-CM

## 2014-06-18 DIAGNOSIS — D649 Anemia, unspecified: Secondary | ICD-10-CM | POA: Insufficient documentation

## 2014-06-18 DIAGNOSIS — F32A Depression, unspecified: Secondary | ICD-10-CM | POA: Diagnosis present

## 2014-06-18 DIAGNOSIS — S0990XA Unspecified injury of head, initial encounter: Secondary | ICD-10-CM | POA: Insufficient documentation

## 2014-06-18 DIAGNOSIS — R569 Unspecified convulsions: Principal | ICD-10-CM

## 2014-06-18 DIAGNOSIS — F329 Major depressive disorder, single episode, unspecified: Secondary | ICD-10-CM | POA: Insufficient documentation

## 2014-06-18 DIAGNOSIS — Z79899 Other long term (current) drug therapy: Secondary | ICD-10-CM | POA: Insufficient documentation

## 2014-06-18 DIAGNOSIS — G43909 Migraine, unspecified, not intractable, without status migrainosus: Secondary | ICD-10-CM | POA: Insufficient documentation

## 2014-06-18 LAB — URINALYSIS, ROUTINE W REFLEX MICROSCOPIC
Bilirubin Urine: NEGATIVE
Glucose, UA: NEGATIVE mg/dL
Hgb urine dipstick: NEGATIVE
Ketones, ur: NEGATIVE mg/dL
LEUKOCYTES UA: NEGATIVE
NITRITE: NEGATIVE
Protein, ur: NEGATIVE mg/dL
SPECIFIC GRAVITY, URINE: 1.009 (ref 1.005–1.030)
UROBILINOGEN UA: 0.2 mg/dL (ref 0.0–1.0)
pH: 7 (ref 5.0–8.0)

## 2014-06-18 LAB — CBC WITH DIFFERENTIAL/PLATELET
BASOS ABS: 0 10*3/uL (ref 0.0–0.1)
Basophils Relative: 0 % (ref 0–1)
Eosinophils Absolute: 0 10*3/uL (ref 0.0–0.7)
Eosinophils Relative: 0 % (ref 0–5)
HCT: 31.8 % — ABNORMAL LOW (ref 36.0–46.0)
Hemoglobin: 9.9 g/dL — ABNORMAL LOW (ref 12.0–15.0)
LYMPHS ABS: 2.2 10*3/uL (ref 0.7–4.0)
Lymphocytes Relative: 47 % — ABNORMAL HIGH (ref 12–46)
MCH: 20.2 pg — ABNORMAL LOW (ref 26.0–34.0)
MCHC: 31.1 g/dL (ref 30.0–36.0)
MCV: 64.9 fL — ABNORMAL LOW (ref 78.0–100.0)
MONOS PCT: 5 % (ref 3–12)
Monocytes Absolute: 0.2 10*3/uL (ref 0.1–1.0)
NEUTROS ABS: 2.3 10*3/uL (ref 1.7–7.7)
NEUTROS PCT: 48 % (ref 43–77)
PLATELETS: 260 10*3/uL (ref 150–400)
RBC: 4.9 MIL/uL (ref 3.87–5.11)
RDW: 18.5 % — ABNORMAL HIGH (ref 11.5–15.5)
WBC: 4.7 10*3/uL (ref 4.0–10.5)

## 2014-06-18 LAB — RAPID URINE DRUG SCREEN, HOSP PERFORMED
Amphetamines: NOT DETECTED
Barbiturates: NOT DETECTED
Benzodiazepines: NOT DETECTED
Cocaine: NOT DETECTED
OPIATES: NOT DETECTED
TETRAHYDROCANNABINOL: NOT DETECTED

## 2014-06-18 LAB — BASIC METABOLIC PANEL
Anion gap: 6 (ref 5–15)
BUN: 15 mg/dL (ref 6–23)
CO2: 27 mmol/L (ref 19–32)
Calcium: 8.7 mg/dL (ref 8.4–10.5)
Chloride: 101 mEq/L (ref 96–112)
Creatinine, Ser: 0.86 mg/dL (ref 0.50–1.10)
GFR calc Af Amer: >90 mL/min (ref >90)
GFR calc non Af Amer: >90 mL/min (ref >90)
Glucose, Bld: 92 mg/dL (ref 70–99)
POTASSIUM: 3.7 mmol/L (ref 3.5–5.1)
SODIUM: 134 mmol/L — AB (ref 135–145)

## 2014-06-18 LAB — PREGNANCY, URINE: Preg Test, Ur: NEGATIVE

## 2014-06-18 LAB — CBG MONITORING, ED: Glucose-Capillary: 80 mg/dL (ref 70–99)

## 2014-06-18 LAB — ETHANOL: Alcohol, Ethyl (B): <5 mg/dL (ref 0–9)

## 2014-06-18 LAB — ACETAMINOPHEN LEVEL

## 2014-06-18 MED ORDER — ACETAMINOPHEN 650 MG RE SUPP: 650.0000 mg | Freq: Four times a day (QID) | RECTAL | Status: DC | PRN

## 2014-06-18 MED ORDER — SODIUM CHLORIDE 0.9 % IV BOLUS (SEPSIS)
1000.0000 mL | Freq: Once | INTRAVENOUS | Status: AC
  Administered 2014-06-18: 1000 mL via INTRAVENOUS

## 2014-06-18 MED ORDER — SODIUM CHLORIDE 0.9 % IJ SOLN
3.0000 mL | Freq: Two times a day (BID) | INTRAMUSCULAR | Status: DC
  Administered 2014-06-18 – 2014-06-19 (×2): 3 mL via INTRAVENOUS

## 2014-06-18 MED ORDER — ONDANSETRON HCL 4 MG PO TABS: 4.0000 mg | ORAL_TABLET | Freq: Four times a day (QID) | ORAL | Status: DC | PRN

## 2014-06-18 MED ORDER — ACETAMINOPHEN 325 MG PO TABS: 650.0000 mg | ORAL_TABLET | Freq: Four times a day (QID) | ORAL | Status: DC | PRN

## 2014-06-18 MED ORDER — ONDANSETRON HCL 4 MG/2ML IJ SOLN: 4.0000 mg | Freq: Four times a day (QID) | INTRAMUSCULAR | Status: DC | PRN

## 2014-06-18 MED ORDER — ENOXAPARIN SODIUM 40 MG/0.4ML ~~LOC~~ SOLN
40.0000 mg | Freq: Every day | SUBCUTANEOUS | Status: DC
  Administered 2014-06-18: 40 mg via SUBCUTANEOUS
  Filled 2014-06-18 (×2): qty 0.4

## 2014-06-18 MED ORDER — LORAZEPAM 2 MG/ML IJ SOLN: 1.0000 mg | INTRAMUSCULAR | Status: DC | PRN
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
## 2014-06-18 NOTE — ED Notes (Signed)
Below orders not completed by EW. 
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
## 2014-06-18 NOTE — ED Notes (Signed)
Bed: WA16 Expected date:  Expected time:  Means of arrival:  Comments: EMS 

## 2014-06-19 ENCOUNTER — Observation Stay (HOSPITAL_COMMUNITY)
Admission: EM | Admit: 2014-06-19 | Discharge: 2014-06-19 | Disposition: A | Discharge status: Home / Self Care | Payer: Self-pay | Source: Home / Self Care | Attending: Internal Medicine | Admitting: Internal Medicine

## 2014-06-19 DIAGNOSIS — G43019 Migraine without aura, intractable, without status migrainosus: Secondary | ICD-10-CM

## 2014-06-19 LAB — IRON AND TIBC
IRON: 32 ug/dL — AB (ref 42–145)
SATURATION RATIOS: 9 % — AB (ref 20–55)
TIBC: 356 ug/dL (ref 250–470)
UIBC: 324 ug/dL (ref 125–400)

## 2014-06-19 LAB — OSMOLALITY, URINE: OSMOLALITY UR: 363 mosm/kg — AB (ref 390–1090)

## 2014-06-19 LAB — COMPREHENSIVE METABOLIC PANEL
ALT: 17 U/L (ref 0–35)
ANION GAP: 7 (ref 5–15)
AST: 20 U/L (ref 0–37)
Albumin: 3.3 g/dL — ABNORMAL LOW (ref 3.5–5.2)
Alkaline Phosphatase: 40 U/L (ref 39–117)
BUN: 15 mg/dL (ref 6–23)
CALCIUM: 8.5 mg/dL (ref 8.4–10.5)
CO2: 24 mmol/L (ref 19–32)
CREATININE: 0.76 mg/dL (ref 0.50–1.10)
Chloride: 106 mEq/L (ref 96–112)
GFR calc Af Amer: >90 mL/min (ref >90)
GLUCOSE: 132 mg/dL — AB (ref 70–99)
Potassium: 3.4 mmol/L — ABNORMAL LOW (ref 3.5–5.1)
Sodium: 137 mmol/L (ref 135–145)
Total Bilirubin: 1 mg/dL (ref 0.3–1.2)
Total Protein: 6.5 g/dL (ref 6.0–8.3)

## 2014-06-19 LAB — CBC WITH DIFFERENTIAL/PLATELET
Basophils Absolute: 0 10*3/uL (ref 0.0–0.1)
Basophils Relative: 0 % (ref 0–1)
EOS ABS: 0.1 10*3/uL (ref 0.0–0.7)
Eosinophils Relative: 2 % (ref 0–5)
HEMATOCRIT: 29.6 % — AB (ref 36.0–46.0)
HEMOGLOBIN: 9.2 g/dL — AB (ref 12.0–15.0)
LYMPHS ABS: 2.5 10*3/uL (ref 0.7–4.0)
Lymphocytes Relative: 61 % — ABNORMAL HIGH (ref 12–46)
MCH: 20.3 pg — ABNORMAL LOW (ref 26.0–34.0)
MCHC: 31.1 g/dL (ref 30.0–36.0)
MCV: 65.2 fL — ABNORMAL LOW (ref 78.0–100.0)
MONO ABS: 0.2 10*3/uL (ref 0.1–1.0)
Monocytes Relative: 6 % (ref 3–12)
NEUTROS ABS: 1.3 10*3/uL — AB (ref 1.7–7.7)
Neutrophils Relative %: 31 % — ABNORMAL LOW (ref 43–77)
PLATELETS: 214 10*3/uL (ref 150–400)
RBC: 4.54 MIL/uL (ref 3.87–5.11)
RDW: 18.4 % — AB (ref 11.5–15.5)
WBC: 4.1 10*3/uL (ref 4.0–10.5)

## 2014-06-19 LAB — PROLACTIN: Prolactin: 14.9 ng/mL

## 2014-06-19 LAB — LACTIC ACID, PLASMA: LACTIC ACID, VENOUS: 1.1 mmol/L (ref 0.5–2.2)

## 2014-06-19 LAB — VITAMIN B12: Vitamin B-12: 611 pg/mL (ref 211–911)

## 2014-06-19 LAB — FOLATE: Folate: >20 ng/mL

## 2014-06-19 LAB — OSMOLALITY: Osmolality: 289 mOsm/kg (ref 275–300)

## 2014-06-19 LAB — FERRITIN: FERRITIN: 6 ng/mL — AB (ref 10–291)

## 2014-06-19 LAB — TSH: TSH: 0.762 u[IU]/mL (ref 0.350–4.500)

## 2014-06-19 MED ORDER — POTASSIUM CHLORIDE CRYS ER 20 MEQ PO TBCR
40.0000 meq | EXTENDED_RELEASE_TABLET | Freq: Once | ORAL | Status: AC
  Administered 2014-06-19: 40 meq via ORAL
  Filled 2014-06-19: qty 2

## 2014-06-19 MED ORDER — TOPIRAMATE 25 MG PO TABS
25.0000 mg | ORAL_TABLET | Freq: Two times a day (BID) | ORAL | Status: DC
Start: 2014-06-19 — End: 2015-07-15
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
## 2014-06-19 NOTE — Progress Notes (Signed)
Discharge instructions and medications reviewed with patient. Patient verbalizes understanding and has no questions at this time. Patient confirms she has all personal belongings in her possession. Patient discharged home.

## 2014-06-19 NOTE — Discharge Instructions (Signed)
Migraine Headache A migraine headache is an intense, throbbing pain on one or both sides of your head. A migraine can last for 30 minutes to several hours. CAUSES  The exact cause of a migraine headache is not always known. However, a migraine may be caused when nerves in the brain become irritated and release chemicals that cause inflammation. This causes pain. Certain things may also trigger migraines, such as:  Alcohol.  Smoking.  Stress.  Menstruation.  Aged cheeses.  Foods or drinks that contain nitrates, glutamate, aspartame, or tyramine.  Lack of sleep.  Chocolate.  Caffeine.  Hunger.  Physical exertion.  Fatigue.  Medicines used to treat chest pain (nitroglycerine), birth control pills, estrogen, and some blood pressure medicines. SIGNS AND SYMPTOMS  Pain on one or both sides of your head.  Pulsating or throbbing pain.  Severe pain that prevents daily activities.  Pain that is aggravated by any physical activity.  Nausea, vomiting, or both.  Dizziness.  Pain with exposure to bright lights, loud noises, or activity.  General sensitivity to bright lights, loud noises, or smells. Before you get a migraine, you may get warning signs that a migraine is coming (aura). An aura may include:  Seeing flashing lights.  Seeing bright spots, halos, or zigzag lines.  Having tunnel vision or blurred vision.  Having feelings of numbness or tingling.  Having trouble talking.  Having muscle weakness. DIAGNOSIS  A migraine headache is often diagnosed based on:  Symptoms.  Physical exam.  A CT scan or MRI of your head. These imaging tests cannot diagnose migraines, but they can help rule out other causes of headaches. TREATMENT Medicines may be given for pain and nausea. Medicines can also be given to help prevent recurrent migraines.  HOME CARE INSTRUCTIONS  Only take over-the-counter or prescription medicines for pain or discomfort as directed by your  health care provider. The use of long-term narcotics is not recommended.  Lie down in a dark, quiet room when you have a migraine.  Keep a journal to find out what may trigger your migraine headaches. For example, write down:  What you eat and drink.  How much sleep you get.  Any change to your diet or medicines.  Limit alcohol consumption.  Quit smoking if you smoke.  Get 7-9 hours of sleep, or as recommended by your health care provider.  Limit stress.  Keep lights dim if bright lights bother you and make your migraines worse. SEEK IMMEDIATE MEDICAL CARE IF:   Your migraine becomes severe.  You have a fever.  You have a stiff neck.  You have vision loss.  You have muscular weakness or loss of muscle control.  You start losing your balance or have trouble walking.  You feel faint or pass out.  You have severe symptoms that are different from your first symptoms. MAKE SURE YOU:   Understand these instructions.  Will watch your condition.  Will get help right away if you are not doing well or get worse. Document Released: 05/23/2005 Document Revised: 10/07/2013 Document Reviewed: 01/28/2013 [facility] Patient Information 2015 [place], [place]. This information is not intended to replace advice given to you by your health care provider. Make sure you discuss any questions you have with your health care provider. Epilepsy Epilepsy is a disorder in which a person has repeated seizures over time. A seizure is a release of abnormal electrical activity in the brain. Seizures can cause a change in attention, behavior, or the ability to remain awake  and alert (altered mental status). Seizures often involve uncontrollable shaking (convulsions).  Most people with epilepsy lead normal lives. However, people with epilepsy are at an increased risk of falls, accidents, and injuries. Therefore, it is important to begin treatment right away. CAUSES  Epilepsy has many possible causes.  Anything that disturbs the normal pattern of brain cell activity can lead to seizures. This may include:   Head injury.  Birth trauma.  High fever as a child.  Stroke.  Bleeding into or around the brain.  Certain drugs.  Prolonged low oxygen, such as what occurs after CPR efforts.  Abnormal brain development.  Certain illnesses, such as meningitis, encephalitis (brain infection), malaria, and other infections.  An imbalance of nerve signaling chemicals (neurotransmitters).  SIGNS AND SYMPTOMS  The symptoms of a seizure can vary greatly from one person to another. Right before a seizure, you may have a warning (aura) that a seizure is about to occur. An aura may include the following symptoms:  Fear or anxiety.  Nausea.  Feeling like the room is spinning (vertigo).  Vision changes, such as seeing flashing lights or spots. Common symptoms during a seizure include:  Abnormal sensations, such as an abnormal smell or a bitter taste in the mouth.   Sudden, general body stiffness.   Convulsions that involve rhythmic jerking of the face, arm, or leg on one or both sides.   Sudden change in consciousness.   Appearing to be awake but not responding.   Appearing to be asleep but cannot be awakened.   Grimacing, chewing, lip smacking, drooling, tongue biting, or loss of bowel or bladder control. After a seizure, you may feel sleepy for a while. DIAGNOSIS  Your health care provider will ask about your symptoms and take a medical history. Descriptions from any witnesses to your seizures will be very helpful in the diagnosis. A physical exam, including a detailed neurological exam, is necessary. Various tests may be done, such as:   An electroencephalogram (EEG). This is a painless test of your brain waves. In this test, a diagram is created of your brain waves. These diagrams can be interpreted by a specialist.  An MRI of the brain.   A CT scan of the brain.   A  spinal tap (lumbar puncture, LP).  Blood tests to check for signs of infection or abnormal blood chemistry. TREATMENT  There is no cure for epilepsy, but it is generally treatable. Once epilepsy is diagnosed, it is important to begin treatment as soon as possible. For most people with epilepsy, seizures can be controlled with medicines. The following may also be used:  A pacemaker for the brain (vagus nerve stimulator) can be used for people with seizures that are not well controlled by medicine.  Surgery on the brain. For some people, epilepsy eventually goes away. HOME CARE INSTRUCTIONS   Follow your health care provider's recommendations on driving and safety in normal activities.  Get enough rest. Lack of sleep can cause seizures.  Only take over-the-counter or prescription medicines as directed by your health care provider. Take any prescribed medicine exactly as directed.  Avoid any known triggers of your seizures.  Keep a seizure diary. Record what you recall about any seizure, especially any possible trigger.   Make sure the people you live and work with know that you are prone to seizures. They should receive instructions on how to help you. In general, a witness to a seizure should:   Cushion your head and body.  Turn you on your side.   Avoid unnecessarily restraining you.   Not place anything inside your mouth.   Call for emergency medical help if there is any question about what has occurred.   Follow up with your health care provider as directed. You may need regular blood tests to monitor the levels of your medicine.  SEEK MEDICAL CARE IF:   You develop signs of infection or other illness. This might increase the risk of a seizure.   You seem to be having more frequent seizures.   Your seizure pattern is changing.  SEEK IMMEDIATE MEDICAL CARE IF:   You have a seizure that does not stop after a few moments.   You have a seizure that causes any  difficulty in breathing.   You have a seizure that results in a very severe headache.   You have a seizure that leaves you with the inability to speak or use a part of your body.  Document Released: 05/23/2005 Document Revised: 03/13/2013 Document Reviewed: 01/02/2013 [facility] Patient Information 2015 [place], [place]. This information is not intended to replace advice given to you by your health care provider. Make sure you discuss any questions you have with your health care provider.

## 2014-06-19 NOTE — Progress Notes (Signed)
CARE MANAGEMENT NOTE 06/19/2014  Patient:  [facility]   Account Number:  [id number]  Date Initiated:  06/19/2014  Documentation initiated by:  Edwyna Shell  Subjective/Objective Assessment:   24 yo female admitted for possible seizures from home     Action/Plan:   discharge planning   Anticipated DC Date:  06/20/2014   Anticipated DC Plan:  [place]  CM consult  PCP issues      Choice offered to / List presented to:             Status of service:  Completed, signed off Medicare Important Message given?   (If response is "NO", the following Medicare IM given date fields will be blank) Date Medicare IM given:   Medicare IM given by:   Date Additional Medicare IM given:   Additional Medicare IM given by:    Discharge Disposition:    Per UR Regulation:    If discussed at Long Length of Stay Meetings, dates discussed:    Comments:  06/19/14 Edwyna Shell RN BSN CM [phone] Patient stated that she let her Medicaid lapse and is in the process of applying. Provided the patient with the 5 page resource list for self pay in [facility]. Also provided the patient with a pamphlet for the [facility] and encouraged her to call and make follow up appointment with PCP. Discussed the importance of follow up care and medication regimen.
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
## 2014-06-19 NOTE — Procedures (Signed)
EEG report.  Brief clinical history: 24 years old female with new onset seizures versus spells. Technique: this is a 17 channel routine scalp EEG performed at the bedside with bipolar and monopolar montages arranged in accordance to the international 10/20 system of electrode placement. One channel was dedicated to EKG recording.  The study was performed during wakefulness and drowsiness. Hyperventilation and intermittent photic stimulation were utilized as activating procedures.  Description:In the wakeful state, the best background consisted of a medium amplitude, posterior dominant, well sustained, symmetric and reactive 10 Hz rhythm. Drowsiness demonstrated dropout of the alpha rhythm. Hyperventilation induced did not induce epileptiform discharges. Intermittent photic stimulation did induce a normal driving response.  No focal or generalized epileptiform discharges noted.  No pathologic areas of slowing seen.  EKG showed sinus rhythm.  Impression: this is a normal awake and drowsy EEG. Please, be aware that a normal EEG does not exclude the possibility of epilepsy.   Clinical correlation is advised.  Dorian Pod, MD
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
## 2014-06-19 NOTE — Progress Notes (Signed)
EEG completed, results pending. 

## 2014-07-23 ENCOUNTER — Ambulatory Visit: Payer: Self-pay | Admitting: Neurology

## 2014-07-27 ENCOUNTER — Encounter (HOSPITAL_COMMUNITY): Payer: Self-pay | Admitting: Emergency Medicine

## 2014-07-27 ENCOUNTER — Encounter (HOSPITAL_COMMUNITY): Payer: Self-pay

## 2014-07-27 ENCOUNTER — Emergency Department (HOSPITAL_COMMUNITY)
Admission: EM | Admit: 2014-07-27 | Discharge: 2014-07-28 | Disposition: A | Discharge status: Home / Self Care | Payer: Self-pay | Attending: Emergency Medicine | Admitting: Emergency Medicine

## 2014-07-27 DIAGNOSIS — R51 Headache: Secondary | ICD-10-CM | POA: Insufficient documentation

## 2014-07-27 DIAGNOSIS — Z3202 Encounter for pregnancy test, result negative: Secondary | ICD-10-CM | POA: Insufficient documentation

## 2014-07-27 DIAGNOSIS — Z8639 Personal history of other endocrine, nutritional and metabolic disease: Secondary | ICD-10-CM | POA: Insufficient documentation

## 2014-07-27 DIAGNOSIS — Z79899 Other long term (current) drug therapy: Secondary | ICD-10-CM | POA: Insufficient documentation

## 2014-07-27 DIAGNOSIS — G40909 Epilepsy, unspecified, not intractable, without status epilepticus: Secondary | ICD-10-CM | POA: Insufficient documentation

## 2014-07-27 DIAGNOSIS — Z8659 Personal history of other mental and behavioral disorders: Secondary | ICD-10-CM | POA: Insufficient documentation

## 2014-07-27 DIAGNOSIS — R55 Syncope and collapse: Secondary | ICD-10-CM | POA: Insufficient documentation

## 2014-07-27 HISTORY — DX: Other disorders of pituitary gland: E23.6

## 2014-07-27 LAB — POC URINE PREG, ED: Preg Test, Ur: NEGATIVE

## 2014-07-27 LAB — I-STAT CHEM 8, ED
BUN: 13 mg/dL (ref 6–23)
Calcium, Ion: 1.24 mmol/L — ABNORMAL HIGH (ref 1.12–1.23)
Chloride: 102 mmol/L (ref 96–112)
Creatinine, Ser: 0.9 mg/dL (ref 0.50–1.10)
GLUCOSE: 96 mg/dL (ref 70–99)
HEMATOCRIT: 39 % (ref 36.0–46.0)
HEMOGLOBIN: 13.3 g/dL (ref 12.0–15.0)
Potassium: 3.8 mmol/L (ref 3.5–5.1)
Sodium: 141 mmol/L (ref 135–145)
TCO2: 24 mmol/L (ref 0–100)

## 2014-07-27 LAB — CBG MONITORING, ED: Glucose-Capillary: 92 mg/dL (ref 70–99)

## 2014-07-27 MED ORDER — ACETAMINOPHEN 500 MG PO TABS
1000.0000 mg | ORAL_TABLET | Freq: Once | ORAL | Status: AC
  Administered 2014-07-27: 1000 mg via ORAL
  Filled 2014-07-27: qty 2

## 2014-07-27 NOTE — ED Provider Notes (Signed)
CSN: [id number]     Arrival date & time 07/27/14  2053 History   First MD Initiated Contact with Patient 07/27/14 2147     Chief Complaint  Patient presents with  . Near Syncope     (Consider location/radiation/quality/duration/timing/severity/associated sxs/prior Treatment) HPI Patient became lightheaded while standing at work tonight symptoms lasted 3-4 minutes. She felt hot and then called immediately prior to the event She eased herself to the ground. She did not lose consciousness. Presently she complains of diffuse headache gradual onset which started while in the emergency department. No treatment prior to coming here patient states she's had 2 similar episodes prior to tonight's, always occurring while at work. She works in a [employer]. Denies chest pain denies shortness of breath denies abdominal pain no visual changes no other complaint. Past Medical History  Diagnosis Date  . Seizures   . Allergy   . Depression   . Migraines   . Enlarged pituitary gland    patient had an EEG January 2016 which was normal Past Surgical History  Procedure Laterality Date  . Appendectomy     Family History  Problem Relation Age of Onset  . Adopted: Yes   History  Substance Use Topics  . Smoking status: Never Smoker   . Smokeless tobacco: Never Used  . Alcohol Use: No   OB History    Gravida Para Term Preterm AB TAB SAB Ectopic Multiple Living   0 0 0 0 0 0 0 0       Review of Systems  Constitutional: Negative.   Respiratory: Negative.   Cardiovascular: Negative.   Gastrointestinal: Negative.   Musculoskeletal: Negative.   Skin: Negative.   Neurological: Positive for headaches.  Psychiatric/Behavioral: Negative.   All other systems reviewed and are negative.     Allergies  Other  Home Medications   Prior to Admission medications   Medication Sig Start Date End Date Taking? Authorizing Provider  cephALEXin (KEFLEX) 500 MG capsule Take 1 capsule (500 mg  total) by mouth 2 (two) times daily. 06/10/14   Everlene Balls, MD  ibuprofen (ADVIL,MOTRIN) 200 MG tablet Take 200 mg by mouth every 6 (six) hours as needed for headache.    Historical Provider, MD  levETIRAcetam (KEPPRA) 500 MG tablet Take 1 tablet (500 mg total) by mouth 2 (two) times daily. 06/10/14   Everlene Balls, MD  promethazine (PHENERGAN) 25 MG tablet Take 1 tablet (25 mg total) by mouth every 6 (six) hours as needed for nausea or vomiting. 06/10/14   Hannah Muthersbaugh, PA-C  sucralfate (CARAFATE) 1 G tablet Take 1 tablet (1 g total) by mouth 4 (four) times daily -  with meals and at bedtime. 06/10/14   Hannah Muthersbaugh, PA-C  topiramate (TOPAMAX) 25 MG tablet Take 1 tablet (25 mg total) by mouth 2 (two) times daily. 06/19/14   Robbie Lis, MD   BP 129/77 mmHg  Pulse 71  Temp(Src) 98.3 F (36.8 C) (Oral)  Resp 15  SpO2 100% Physical Exam  Constitutional: She is oriented to person, place, and time. She appears well-developed and well-nourished.  HENT:  Head: Normocephalic and atraumatic.  Eyes: Conjunctivae are normal. Pupils are equal, round, and reactive to light.  Neck: Neck supple. No tracheal deviation present. No thyromegaly present.  Cardiovascular: Normal rate and regular rhythm.   No murmur heard. Pulmonary/Chest: Effort normal and breath sounds normal.  Abdominal: Soft. Bowel sounds are normal. She exhibits no distension. There is no tenderness.  Musculoskeletal: Normal  range of motion. She exhibits no edema or tenderness.  Neurological: She is alert and oriented to person, place, and time. No cranial nerve deficit. Coordination normal.  DTRs symmetric bilaterally at knee jerk and biceps gait normal not lightheaded on standing  Skin: Skin is warm and dry. No rash noted.  Psychiatric: She has a normal mood and affect.  Nursing note and vitals reviewed.   ED Course  Procedures (including critical care time) Labs Review Labs Reviewed  I-STAT CHEM 8, ED - Abnormal; Notable  for the following:    Calcium, Ion 1.24 (*)    All other components within normal limits  CBG MONITORING, ED  POC URINE PREG, ED    Imaging Review No results found.   EKG Interpretation   Date/Time:  Sunday July 27 2014 20:56:51 EST Ventricular Rate:  81 PR Interval:  118 QRS Duration: 86 QT Interval:  368 QTC Calculation: 427 R Axis:   81 Text Interpretation:  Normal sinus rhythm with sinus arrhythmia Normal ECG  No old tracing to compare Confirmed by Winfred Leeds  MD, Jedrek Dinovo [id number]) on  07/27/2014 10:08:02 PM     Results for orders placed or performed during the hospital encounter of 07/27/14  CBG, ED  Result Value Ref Range   Glucose-Capillary 92 70 - 99 mg/dL  I-Stat Chem 8, ED  Result Value Ref Range   Sodium 141 135 - 145 mmol/L   Potassium 3.8 3.5 - 5.1 mmol/L   Chloride 102 96 - 112 mmol/L   BUN 13 6 - 23 mg/dL   Creatinine, Ser 0.90 0.50 - 1.10 mg/dL   Glucose, Bld 96 70 - 99 mg/dL   Calcium, Ion 1.24 (H) 1.12 - 1.23 mmol/L   TCO2 24 0 - 100 mmol/L   Hemoglobin 13.3 12.0 - 15.0 g/dL   HCT 39.0 36.0 - 46.0 %  POC Urine Preg, ED (if patient is pre-menopausal female) NOT at [facility]  Result Value Ref Range   Preg Test, Ur NEGATIVE NEGATIVE   No results found.  MDM   NEARSyncope felt to be vasovagal in etiology. Patient standing states room got hot. Normal EKG. Final diagnoses:  None   plan: referral [facility]. Patient is encouraged to keep her scheduled appointment with her neurologist next week Diagnosis near syncope      Orlie Dakin, MD 07/27/14 2357

## 2014-07-27 NOTE — ED Notes (Signed)
Patient was at work tonight and began to feel light headed, dizzy and hot and had an episode of near syncope. Patient remembers falling backwards and trying to catch herself but does not remember he rest of the event. Patient reports this is the 4th time this has happened in 4 months since she started her job. Patient had nausea and shortness of breath during the episode but reports symptoms have resolved now. Denies pain. A/o x4, NAD

## 2014-07-28 NOTE — Discharge Instructions (Signed)
Near-Syncope We  feel that the cause of all of your near-syncope event tonight was not dangerous. Call the [facility] tomorrow to get a primary care physician. Keep your scheduled appointment with your neurologist this week Near-syncope (commonly known as near fainting) is sudden weakness, dizziness, or feeling like you might pass out. During an episode of near-syncope, you may also develop pale skin, have tunnel vision, or feel sick to your stomach (nauseous). Near-syncope may occur when getting up after sitting or while standing for a long time. It is caused by a sudden decrease in blood flow to the brain. This decrease can result from various causes or triggers, most of which are not serious. However, because near-syncope can sometimes be a sign of something serious, a medical evaluation is required. The specific cause is often not determined. HOME CARE INSTRUCTIONS  Monitor your condition for any changes. The following actions may help to alleviate any discomfort you are experiencing:  Have someone stay with you until you feel stable.  Lie down right away and prop your feet up if you start feeling like you might faint. Breathe deeply and steadily. Wait until all the symptoms have passed. Most of these episodes last only a few minutes. You may feel tired for several hours.   Drink enough fluids to keep your urine clear or pale yellow.   If you are taking blood pressure or heart medicine, get up slowly when seated or lying down. Take several minutes to sit and then stand. This can reduce dizziness.  Follow up with your health care provider as directed. SEEK IMMEDIATE MEDICAL CARE IF:   You have a severe headache.   You have unusual pain in the chest, abdomen, or back.   You are bleeding from the mouth or rectum, or you have black or tarry stool.   You have an irregular or very fast heartbeat.   You have repeated fainting or have seizure-like jerking during an  episode.   You faint when sitting or lying down.   You have confusion.   You have difficulty walking.   You have severe weakness.   You have vision problems.  MAKE SURE YOU:   Understand these instructions.  Will watch your condition.  Will get help right away if you are not doing well or get worse. Document Released: 05/23/2005 Document Revised: 05/28/2013 Document Reviewed: 10/26/2012 [facility] Patient Information 2015 [place], [place]. This information is not intended to replace advice given to you by your health care provider. Make sure you discuss any questions you have with your health care provider.

## 2014-08-04 ENCOUNTER — Telehealth: Payer: Self-pay | Admitting: Neurology

## 2014-08-04 NOTE — Telephone Encounter (Signed)
Pt no showed 07/23/14 NP appt w/ Dr. Delice Lesch. No show letter + policy mailed to pt / Sherri S.

## 2014-08-08 ENCOUNTER — Encounter: Payer: Self-pay | Admitting: *Deleted

## 2014-08-11 ENCOUNTER — Encounter (HOSPITAL_COMMUNITY): Payer: Self-pay | Admitting: *Deleted

## 2014-08-11 ENCOUNTER — Emergency Department (HOSPITAL_COMMUNITY)
Admission: EM | Admit: 2014-08-11 | Discharge: 2014-08-11 | Disposition: A | Discharge status: Home / Self Care | Payer: Self-pay | Attending: Emergency Medicine | Admitting: Emergency Medicine

## 2014-08-11 DIAGNOSIS — G43909 Migraine, unspecified, not intractable, without status migrainosus: Secondary | ICD-10-CM | POA: Insufficient documentation

## 2014-08-11 DIAGNOSIS — Z8659 Personal history of other mental and behavioral disorders: Secondary | ICD-10-CM | POA: Insufficient documentation

## 2014-08-11 DIAGNOSIS — R590 Localized enlarged lymph nodes: Secondary | ICD-10-CM | POA: Insufficient documentation

## 2014-08-11 DIAGNOSIS — Z79899 Other long term (current) drug therapy: Secondary | ICD-10-CM | POA: Insufficient documentation

## 2014-08-11 DIAGNOSIS — Z8639 Personal history of other endocrine, nutritional and metabolic disease: Secondary | ICD-10-CM | POA: Insufficient documentation

## 2014-08-11 DIAGNOSIS — G40909 Epilepsy, unspecified, not intractable, without status epilepticus: Secondary | ICD-10-CM | POA: Insufficient documentation

## 2014-08-11 DIAGNOSIS — Z792 Long term (current) use of antibiotics: Secondary | ICD-10-CM | POA: Insufficient documentation

## 2014-08-11 MED ORDER — HYDROCODONE-ACETAMINOPHEN 5-325 MG PO TABS
1.0000 | ORAL_TABLET | Freq: Once | ORAL | Status: AC
  Administered 2014-08-11: 1 via ORAL
  Filled 2014-08-11: qty 1

## 2014-08-11 MED ORDER — IBUPROFEN 800 MG PO TABS: 800.0000 mg | ORAL_TABLET | Freq: Three times a day (TID) | ORAL | Status: DC

## 2014-08-11 NOTE — ED Notes (Signed)
Patient presents with "lump" to the left posterior neck.  Has been going on for 2 weeks.  Has an appointment with her PMD tomorrow but states the pain is real bad

## 2014-08-11 NOTE — Discharge Instructions (Signed)
1. Medications: ibuprofen, usual home medications 2. Treatment: rest, drink plenty of fluids,  3. Follow Up: Please followup with your primary doctor tomorrow for discussion of your diagnoses and further evaluation after today's visit; if you do not have a primary care doctor use the resource guide provided to find one; Please return to the ER for worsening symptoms, high fevers, stiff neck    Swollen Lymph Nodes The lymphatic system filters fluid from around cells. It is like a system of blood vessels. These channels carry lymph instead of blood. The lymphatic system is an important part of the immune (disease fighting) system. When people talk about "swollen glands in the neck," they are usually talking about swollen lymph nodes. The lymph nodes are like the little traps for infection. You and your caregiver may be able to feel lymph nodes, especially swollen nodes, in these common areas: the groin (inguinal area), armpits (axilla), and above the clavicle (supraclavicular). You may also feel them in the neck (cervical) and the back of the head just above the hairline (occipital). Swollen glands occur when there is any condition in which the body responds with an allergic type of reaction. For instance, the glands in the neck can become swollen from insect bites or any type of minor infection on the head. These are very noticeable in children with only minor problems. Lymph nodes may also become swollen when there is a tumor or problem with the lymphatic system, such as Hodgkin's disease. TREATMENT   Most swollen glands do not require treatment. They can be observed (watched) for a short period of time, if your caregiver feels it is necessary. Most of the time, observation is not necessary.  Antibiotics (medicines that kill germs) may be prescribed by your caregiver. Your caregiver may prescribe these if he or she feels the swollen glands are due to a bacterial (germ) infection. Antibiotics are not  used if the swollen glands are caused by a virus. HOME CARE INSTRUCTIONS   Take medications as directed by your caregiver. Only take over-the-counter or prescription medicines for pain, discomfort, or fever as directed by your caregiver. SEEK MEDICAL CARE IF:   If you begin to run a temperature greater than 102 F (38.9 C), or as your caregiver suggests. MAKE SURE YOU:   Understand these instructions.  Will watch your condition.  Will get help right away if you are not doing well or get worse. Document Released: 05/13/2002 Document Revised: 08/15/2011 Document Reviewed: 05/23/2005 [facility] Patient Information 2015 [place], [place]. This information is not intended to replace advice given to you by your health care provider. Make sure you discuss any questions you have with your health care provider.    Emergency Department Resource Guide 1) Find a Doctor and Pay Out of Pocket Although you won't have to find out who is covered by your insurance plan, it is a good idea to ask around and get recommendations. You will then need to call the office and see if the doctor you have chosen will accept you as a new patient and what types of options they offer for patients who are self-pay. Some doctors offer discounts or will set up payment plans for their patients who do not have insurance, but you will need to ask so you aren't surprised when you get to your appointment.  2) Contact Your Local Health Department Not all health departments have doctors that can see patients for sick visits, but many do, so it is worth a call to  see if yours does. If you don't know where your local health department is, you can check in your phone book. The CDC also has a tool to help you locate your state's health department, and many state websites also have listings of all of their local health departments.  3) Find a [facility] If your illness is not likely to be very severe or complicated, you may want to try  a walk in clinic. These are popping up all over the country in pharmacies, drugstores, and shopping centers. They're usually staffed by nurse practitioners or physician assistants that have been trained to treat common illnesses and complaints. They're usually fairly quick and inexpensive. However, if you have serious medical issues or chronic medical problems, these are probably not your best option.  No Primary Care Doctor: - Call Health Connect at  [phone] - they can help you locate a primary care doctor that  accepts your insurance, provides certain services, etc. - Physician Referral Service- [phone]  Chronic Pain Problems: Organization         Address  Phone   Notes  [facility]  [phone] Patients need to be referred by their primary care doctor.   Medication Assistance: Organization         Address  Phone   Notes  [facility] Medication [facility] [place] [place], [place] [address] [phone] --Must be a resident of [facility] -- Must have NO insurance coverage whatsoever (no Medicaid/ Medicare, etc.) -- The pt. MUST have a primary care doctor that directs their care regularly and follows them in the community   MedAssist  [phone]   [employer]  [phone]    Agencies that provide inexpensive medical care: Organization         Address  Phone   Notes  [place]  [phone]   [facility]    [phone]   [facility] [place], [place] [address] [phone]   [place] [address] [place] [phone]   Planned Parenthood    [phone]   [facility]    [phone]   [place] and [place] [address], [place] Phone:  [phone], Fax:  [phone] Hours of Operation:  9 am - 6 pm, M-F.  Also accepts Medicaid/Medicare and self-pay.  [facility] for [place], [address], [place] Phone: [phone], Fax: [phone]. Hours of Operation:  8:30 am - 5:30 pm, M-F.  Also accepts Medicaid and self-pay.  [facility] [place] [address], [place] Phone: [phone]   [place], [place], [place] [phone], Ext. [phone] Mondays & Thursdays: 7-9 AM.  First 15 patients are seen on a first come, first serve basis.    [place] Providers:  Organization         Address  Phone   Notes  [facility] [address], [place] [phone] Also accepts self-pay patients.  [place], [place]  [phone]   [place], [address], [place] [phone]   [place] [address] [place] [phone]   Lucianne Lei [address], [place]   [phone] Only accepts [place] Access Medicaid patients after they have their name applied to their card.   Self-Pay (no insurance) in [facility]:  Organization         Address  Phone   Notes  Sickle Cell Patients, [facility] [place] [phone]   [facility] [place] [phone]   [facility] [place]  [place], [place], [place] [phone]   [facility]/Dr. Osei-Bonsu  [address] [place] or [address], [place] [phone] Phone number for both [place] and [place] locations is the same.  Urgent Medical and [facility] [address], [place] [phone]   [facility] [address] [place] or [address] [phone]   [facility] [address] [place] [phone], phone; [phone], fax  Sees patients 1st and 3rd Saturday of every month.  Must not qualify for public or private insurance (i.e. Medicaid, Medicare, [place] Health Choice, Veterans' Benefits)  Household income should be no more than 200% of the poverty level The clinic cannot treat you if you are pregnant or think you are pregnant  Sexually transmitted diseases are not treated at the clinic.    Dental Care: Organization         Address  Phone  Notes  [facility] Department of [facility] [place] [phone] Accepts children up to age 77 who are enrolled in [place] or [place]; pregnant women with a Medicaid card; and children who have applied for Medicaid or [place] Health Choice, but were declined, whose parents can pay a reduced fee at time of service.  [facility] Department of [facility]  [address], [place] [phone] Accepts children up to age 59 who are enrolled in [place] or [place]; pregnant women with a Medicaid card; and children who have applied for Medicaid or [place] Health Choice, but were declined, whose parents can pay a reduced fee at time of service.  [place] Adult Dental Access PROGRAM  [place] [phone] Patients are seen by appointment only. Walk-ins are not accepted. [place] will see patients 80 years of age and older. Monday - Tuesday (8am-5pm) Most Wednesdays (8:30-5pm) $30 per visit, cash only  [facility] Adult [facility] Access PROGRAM  [address], [facility] [phone] Patients are seen by appointment only. Walk-ins are not accepted. [place] will see patients 32 years of age and older. One Wednesday Evening (Monthly: Volunteer Based).  $30 per visit, cash only  [place]  [phone] for adults; Children under age 20, call Graduate Pediatric Dentistry at [phone]. Children aged 48-14, please call [phone] to  request a pediatric application.  Dental services are provided in all areas of dental care including fillings, crowns and bridges, complete and partial dentures, implants, gum treatment, root canals, and extractions. Preventive care is also provided. Treatment is provided to both adults and children. Patients are selected via a lottery and there is often a waiting list.   [facility] [address] [place]  [phone] [email or web address], [place], [place] [phone], Ext. [phone] Second and Fourth Thursday of each month, opens at 6:30  AM; Clinic ends at 9 AM.  Patients are seen on a first-come first-served basis, and a limited number are seen during each clinic.   [facility]  [address] Hillard Danker [place], [place] [phone]   Eligibility Requirements You must have lived in [place], [place], or [place] counties for at least the last three months.   You cannot be eligible for state or federal sponsored [employer], including [employer], [place], or [employer].   You generally cannot be eligible for healthcare insurance through your employer.    How to apply: Eligibility screenings are held every Tuesday and Wednesday afternoon from 1:00 pm until 4:00 pm. You do not need an appointment for the interview!  [facility] [address], [address] [place], [place]   [place]  [place] Department  [place]  [phone]    Behavioral Health Resources in the Community: Intensive Outpatient Programs Organization         Address  Phone  Notes  [place]. [address], [place], [place] [phone]   [facility]patient [address] [place], [place]   ADS: Alcohol & Drug Svcs [address], [place], [place]   [place] [address]  [place], [place] or [phone]   Substance Abuse Resources Organization         Address  Phone  Notes  Alcohol and Drug Services  [phone]   [place]  [phone]   The [place]   Chinita Pester  [phone]   Residential & Outpatient Substance Abuse Program  [phone]   Psychological Services Organization         Address  Phone  Notes  [facility] [place]  [place]  [phone]   [place] [address], [place] or [phone]    Mobile Crisis Teams Organization         Address  Phone  Notes  [facility], Mobile Crisis Care Unit  [phone]   [facility] Psychotherapeutic Services  [address] [place], [place]   [place] [address], [place] [phone]    Self-Help/Support Groups Organization         Address  Phone             Notes  [place]. of Congress - variety of support groups  [place] Call for more information  Narcotics Anonymous (NA), [facility] [address], [employer] [place]  2 meetings at this location   Special educational needs teacher         Address  Phone  Notes  [facility] Residential Treatment [place],    [place]  [phone]   [facility]  [address], [place] [address], [place], [place]   [place], [place] [phone] Admissions: 8am-3pm M-F  Incentives Substance [place] [address],    [place], [place] [phone]   [facility] [address] Jadene Pierini [place], [place]   [facility] [address],  [place], [place]   Insight Programs - Intensive Outpatient [address], [place], [place]   [facility] ([place].) [address] [place], [place] or [phone]  [facility] Services (RTS) [address], [place], [place] Accepts Medicaid  [facility] [place] [address]  [place] [phone] Substance Abuse/Addiction Treatment   [facility] Organization         Address  Phone  Notes  [facility]  [phone]   Domenic Schwab, PhD [address] Arlis Porta [place], [place]   [phone] or [phone]   [facility] [place], [place] [phone]   [address], [place], [place] [phone] Insurance/Medicaid/sponsorship through [facility] and Families [address], [address] [place]                                    [place], [place] [phone] [place] [address], [place] [phone]    Dr. Adele Schilder  [phone]   [facility] [place] Dept. 1) [address] [place] 2) [place] 3)  [address], [place] [phone]  [phone]   [place] [phone] or [phone] (After Hours)

## 2014-08-11 NOTE — ED Provider Notes (Signed)
CSN: [id number]     Arrival date & time 08/11/14  1947 History   First MD Initiated Contact with Patient 08/11/14 2008     Chief Complaint  Patient presents with  . Lump on Neck      (Consider location/radiation/quality/duration/timing/severity/associated sxs/prior Treatment) The history is provided by the patient and medical records. No language interpreter was used.     Candace Barnes is a 24 y.o. female  with a hx of seizures, depression, migraine headache presents to the Emergency Department complaining of gradual, persistent, progressively worsening "lump" on the left side of her neck onset 2 weeks ago. Patient reports pain is aching, rated at a 10/10 and nonradiating. She reports taking Tylenol PM and Aleve PM with moderate relief.  Patient reports she has an appointment with her primary care physician tomorrow. She denies any recent viral illnesses or head or neck infections.  Patient reports rubbing the lump without relief.  Nothing seems to make her symptoms better or worse.  Pt denies fever, chills, headache, neck stiffness, chest pain, shortness of breath, nausea, vomiting, diarrhea, weakness, dizziness, syncope, dysuria, hematuria.  Patient also denies night sweats, recent weight loss, cough.     Past Medical History  Diagnosis Date  . Seizures   . Allergy   . Depression   . Migraines   . Enlarged pituitary gland    Past Surgical History  Procedure Laterality Date  . Appendectomy     Family History  Problem Relation Age of Onset  . Adopted: Yes   History  Substance Use Topics  . Smoking status: Never Smoker   . Smokeless tobacco: Never Used  . Alcohol Use: No   OB History    Gravida Para Term Preterm AB TAB SAB Ectopic Multiple Living   0 0 0 0 0 0 0 0       Review of Systems  Constitutional: Negative for fever, diaphoresis, appetite change, fatigue and unexpected weight change.  HENT: Negative for mouth sores.   Eyes: Negative for visual disturbance.   Respiratory: Negative for cough, chest tightness, shortness of breath and wheezing.   Cardiovascular: Negative for chest pain.  Gastrointestinal: Negative for nausea, vomiting, abdominal pain, diarrhea and constipation.  Endocrine: Negative for polydipsia, polyphagia and polyuria.  Genitourinary: Negative for dysuria, urgency, frequency and hematuria.  Musculoskeletal: Positive for neck pain. Negative for back pain and neck stiffness.  Skin: Negative for rash.  Allergic/Immunologic: Negative for immunocompromised state.  Neurological: Negative for syncope, light-headedness and headaches.  Hematological: Positive for adenopathy.  Psychiatric/Behavioral: Negative for sleep disturbance. The patient is not nervous/anxious.       Allergies  Other  Home Medications   Prior to Admission medications   Medication Sig Start Date End Date Taking? Authorizing Provider  cephALEXin (KEFLEX) 500 MG capsule Take 1 capsule (500 mg total) by mouth 2 (two) times daily. 06/10/14   Everlene Balls, MD  ibuprofen (ADVIL,MOTRIN) 800 MG tablet Take 1 tablet (800 mg total) by mouth 3 (three) times daily. 08/11/14   Rafay Dahan, PA-C  levETIRAcetam (KEPPRA) 500 MG tablet Take 1 tablet (500 mg total) by mouth 2 (two) times daily. 06/10/14   Everlene Balls, MD  promethazine (PHENERGAN) 25 MG tablet Take 1 tablet (25 mg total) by mouth every 6 (six) hours as needed for nausea or vomiting. 06/10/14   Monterrius Cardosa, PA-C  sucralfate (CARAFATE) 1 G tablet Take 1 tablet (1 g total) by mouth 4 (four) times daily -  with meals and at bedtime.  06/10/14   Yi Falletta, PA-C  topiramate (TOPAMAX) 25 MG tablet Take 1 tablet (25 mg total) by mouth 2 (two) times daily. 06/19/14   Robbie Lis, MD   BP 133/77 mmHg  Pulse 72  Temp(Src) 98.9 F (37.2 C)  Resp 18  Ht 6\' 1"  (1.854 m)  SpO2 100%  LMP 08/02/2014 Physical Exam  Constitutional: She is oriented to person, place, and time. She appears well-developed and  well-nourished. No distress.  Awake, alert, nontoxic appearance  HENT:  Head: Normocephalic and atraumatic.  Right Ear: Tympanic membrane, external ear and ear canal normal.  Left Ear: Tympanic membrane, external ear and ear canal normal.  Nose: Nose normal. No epistaxis. Right sinus exhibits no maxillary sinus tenderness and no frontal sinus tenderness. Left sinus exhibits no maxillary sinus tenderness and no frontal sinus tenderness.  Mouth/Throat: Uvula is midline, oropharynx is clear and moist and mucous membranes are normal. Mucous membranes are not pale and not cyanotic. No oropharyngeal exudate, posterior oropharyngeal edema, posterior oropharyngeal erythema or tonsillar abscesses.  Eyes: Conjunctivae are normal. Pupils are equal, round, and reactive to light. No scleral icterus.  Neck: Normal range of motion and full passive range of motion without pain. Neck supple.  Full range of motion without pain No midline or paraspinal tenderness The nuchal rigidity or meningeal signs  Cardiovascular: Normal rate, regular rhythm and intact distal pulses.   Pulmonary/Chest: Effort normal and breath sounds normal. No stridor. No respiratory distress. She has no wheezes.  Equal chest expansion  Abdominal: Soft. Bowel sounds are normal. She exhibits no mass. There is no tenderness. There is no rebound and no guarding.  Musculoskeletal: Normal range of motion. She exhibits no edema.  Lymphadenopathy:       Head (right side): No submental, no submandibular, no tonsillar, no preauricular, no posterior auricular and no occipital adenopathy present.       Head (left side): No submental, no submandibular, no tonsillar, no preauricular, no posterior auricular and no occipital adenopathy present.    She has cervical adenopathy.       Right cervical: No superficial cervical, no deep cervical and no posterior cervical adenopathy present.      Left cervical: Posterior cervical ( Discrete, mobile, tender)  adenopathy present. No superficial cervical and no deep cervical adenopathy present.    She has no axillary adenopathy.       Right axillary: No pectoral and no lateral adenopathy present.       Left axillary: No pectoral and no lateral adenopathy present.      Right: No supraclavicular adenopathy present.       Left: No supraclavicular adenopathy present.  Left-sided posterior cervical adenopathy No other head adenopathy or axillary adenopathy No supraclavicular adenopathy  Neurological: She is alert and oriented to person, place, and time.  Speech is clear and goal oriented Moves extremities without ataxia  Skin: Skin is warm and dry. No rash noted. She is not diaphoretic.  Psychiatric: She has a normal mood and affect.  Nursing note and vitals reviewed.   ED Course  Procedures (including critical care time) Labs Review Labs Reviewed - No data to display  Imaging Review No results found.   EKG Interpretation None      MDM   Final diagnoses:  Lymphadenopathy, posterior cervical   Candace Barnes presents with left-sided neck pain and left-sided posterior cervical adenopathy. Mild pain to palpation of the left sternocleidomastoid however patient has full range of motion of her neck without  stiffness or significant pain. Mild tenderness to palpation of the lymph node.  No evidence of meningitis. No evidence of cellulitis. Patient denies viral illness. Lymph node is likely reactive however recommend she follow-up with her primary care physician at her appointment tomorrow for further evaluation. Pain control given here in the emergency department. Patient will be discharged home with ibuprofen.  I have personally reviewed patient's vitals, nursing note and any pertinent labs or imaging.  I performed an focused physical exam; undressed when appropriate .    It has been determined that no acute conditions requiring further emergency intervention are present at this time. The  patient/guardian have been advised of the diagnosis and plan. I reviewed any labs and imaging including any potential incidental findings. We have discussed signs and symptoms that warrant return to the ED and they are listed in the discharge instructions.    Vital signs are stable at discharge.   BP 133/77 mmHg  Pulse 72  Temp(Src) 98.9 F (37.2 C)  Resp 18  Ht 6\' 1"  (1.854 m)  SpO2 100%  LMP 08/02/2014        Abigail Butts, PA-C 08/11/14 2053  Alfonzo Beers, MD 08/11/14 2108

## 2014-08-28 ENCOUNTER — Encounter (HOSPITAL_COMMUNITY): Payer: Self-pay | Admitting: Emergency Medicine

## 2014-08-28 ENCOUNTER — Emergency Department (HOSPITAL_COMMUNITY)
Admission: EM | Admit: 2014-08-28 | Discharge: 2014-08-29 | Disposition: A | Discharge status: Home / Self Care | Payer: Self-pay | Attending: Emergency Medicine | Admitting: Emergency Medicine

## 2014-08-28 DIAGNOSIS — Z8639 Personal history of other endocrine, nutritional and metabolic disease: Secondary | ICD-10-CM | POA: Insufficient documentation

## 2014-08-28 DIAGNOSIS — F329 Major depressive disorder, single episode, unspecified: Secondary | ICD-10-CM | POA: Insufficient documentation

## 2014-08-28 DIAGNOSIS — Z3202 Encounter for pregnancy test, result negative: Secondary | ICD-10-CM | POA: Insufficient documentation

## 2014-08-28 DIAGNOSIS — G40909 Epilepsy, unspecified, not intractable, without status epilepticus: Secondary | ICD-10-CM | POA: Insufficient documentation

## 2014-08-28 DIAGNOSIS — G43909 Migraine, unspecified, not intractable, without status migrainosus: Secondary | ICD-10-CM | POA: Insufficient documentation

## 2014-08-28 DIAGNOSIS — Z79899 Other long term (current) drug therapy: Secondary | ICD-10-CM | POA: Insufficient documentation

## 2014-08-28 NOTE — ED Notes (Signed)
Per EMS- per boyfriend, patient fell to the floor and seized for approx 3 minutes involving full body. 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 Does not take seizure medication d/t not being able to afford it. Did not hit her head. No bruises/deformities noted. Denies pain. C/o fatigue currently. VS: BP 110/68 HR 76 Regular (SR on monitor) SpO2 100%  CBG 103. Denies N/V/D, dizziness. No other c/c.

## 2014-08-29 ENCOUNTER — Encounter (HOSPITAL_COMMUNITY): Payer: Self-pay | Admitting: Emergency Medicine

## 2014-08-29 LAB — CBC WITH DIFFERENTIAL/PLATELET
Basophils Absolute: 0 10*3/uL (ref 0.0–0.1)
Basophils Relative: 0 % (ref 0–1)
Eosinophils Absolute: 0.1 10*3/uL (ref 0.0–0.7)
Eosinophils Relative: 1 % (ref 0–5)
HCT: 29.8 % — ABNORMAL LOW (ref 36.0–46.0)
HEMOGLOBIN: 9.3 g/dL — AB (ref 12.0–15.0)
Lymphocytes Relative: 42 % (ref 12–46)
Lymphs Abs: 2.2 10*3/uL (ref 0.7–4.0)
MCH: 20.3 pg — ABNORMAL LOW (ref 26.0–34.0)
MCHC: 31.2 g/dL (ref 30.0–36.0)
MCV: 65.1 fL — ABNORMAL LOW (ref 78.0–100.0)
MONOS PCT: 8 % (ref 3–12)
Monocytes Absolute: 0.4 10*3/uL (ref 0.1–1.0)
NEUTROS PCT: 49 % (ref 43–77)
Neutro Abs: 2.6 10*3/uL (ref 1.7–7.7)
PLATELETS: 218 10*3/uL (ref 150–400)
RBC: 4.58 MIL/uL (ref 3.87–5.11)
RDW: 18.7 % — ABNORMAL HIGH (ref 11.5–15.5)
WBC: 5.3 10*3/uL (ref 4.0–10.5)

## 2014-08-29 LAB — I-STAT CHEM 8, ED
BUN: 13 mg/dL (ref 6–23)
CALCIUM ION: 1.11 mmol/L — AB (ref 1.12–1.23)
CHLORIDE: 102 mmol/L (ref 96–112)
CREATININE: 0.8 mg/dL (ref 0.50–1.10)
GLUCOSE: 90 mg/dL (ref 70–99)
HEMATOCRIT: 33 % — AB (ref 36.0–46.0)
Hemoglobin: 11.2 g/dL — ABNORMAL LOW (ref 12.0–15.0)
Potassium: 3.6 mmol/L (ref 3.5–5.1)
Sodium: 138 mmol/L (ref 135–145)
TCO2: 20 mmol/L (ref 0–100)

## 2014-08-29 LAB — POC URINE PREG, ED: Preg Test, Ur: NEGATIVE

## 2014-08-29 MED ORDER — LEVETIRACETAM IN NACL 1000 MG/100ML IV SOLN
1000.0000 mg | Freq: Once | INTRAVENOUS | Status: AC
  Administered 2014-08-29: 1000 mg via INTRAVENOUS
  Filled 2014-08-29: qty 100

## 2014-08-29 MED ORDER — LEVETIRACETAM 500 MG PO TABS: 500.0000 mg | ORAL_TABLET | Freq: Two times a day (BID) | ORAL | Status: DC

## 2014-08-29 NOTE — ED Notes (Signed)
Aware urine sample is needed. 

## 2014-08-29 NOTE — ED Provider Notes (Signed)
CSN: [id number]     Arrival date & time 08/28/14  2347 History   First MD Initiated Contact with Patient 08/29/14 0015     Chief Complaint  Patient presents with  . Seizures     (Consider location/radiation/quality/duration/timing/severity/associated sxs/prior Treatment) Patient is a 24 y.o. female presenting with seizures. The history is provided by the patient and a friend.  Seizures Seizure activity on arrival: no   Seizure type:  Grand mal Preceding symptoms: no sensation of an aura present   Initial focality:  None Episode characteristics: generalized shaking and unresponsiveness   Postictal symptoms: no confusion   Return to baseline: yes   Severity:  Mild Timing:  Once Progression:  Resolved Context: medical non-compliance   Context: not alcohol withdrawal   Recent head injury:  No recent head injuries PTA treatment:  None History of seizures: yes   Severity:  Mild Seizure control level:  Poorly controlled Current therapy:  None Compliance with current therapy:  Poor Did not hit head no trauma  Past Medical History  Diagnosis Date  . Seizures   . Allergy   . Depression   . Migraines   . Enlarged pituitary gland    Past Surgical History  Procedure Laterality Date  . Appendectomy     Family History  Problem Relation Age of Onset  . Adopted: Yes   History  Substance Use Topics  . Smoking status: Never Smoker   . Smokeless tobacco: Never Used  . Alcohol Use: No   OB History    Gravida Para Term Preterm AB TAB SAB Ectopic Multiple Living   0 0 0 0 0 0 0 0       Review of Systems  Neurological: Positive for seizures.  All other systems reviewed and are negative.     Allergies  Other  Home Medications   Prior to Admission medications   Medication Sig Start Date End Date Taking? Authorizing Provider  cephALEXin (KEFLEX) 500 MG capsule Take 1 capsule (500 mg total) by mouth 2 (two) times daily. Patient not taking: Reported on 08/29/2014 06/10/14    Everlene Balls, MD  ibuprofen (ADVIL,MOTRIN) 800 MG tablet Take 1 tablet (800 mg total) by mouth 3 (three) times daily. Patient not taking: Reported on 08/29/2014 08/11/14   Jarrett Soho Muthersbaugh, PA-C  levETIRAcetam (KEPPRA) 500 MG tablet Take 1 tablet (500 mg total) by mouth 2 (two) times daily. Patient not taking: Reported on 08/29/2014 06/10/14   Everlene Balls, MD  promethazine (PHENERGAN) 25 MG tablet Take 1 tablet (25 mg total) by mouth every 6 (six) hours as needed for nausea or vomiting. Patient not taking: Reported on 08/29/2014 06/10/14   Jarrett Soho Muthersbaugh, PA-C  sucralfate (CARAFATE) 1 G tablet Take 1 tablet (1 g total) by mouth 4 (four) times daily -  with meals and at bedtime. Patient not taking: Reported on 08/29/2014 06/10/14   Jarrett Soho Muthersbaugh, PA-C  topiramate (TOPAMAX) 25 MG tablet Take 1 tablet (25 mg total) by mouth 2 (two) times daily. Patient not taking: Reported on 08/29/2014 06/19/14   Robbie Lis, MD   BP 114/56 mmHg  Pulse 70  Temp(Src) 97.9 F (36.6 C) (Oral)  Resp 17  SpO2 100%  LMP 08/02/2014 Physical Exam  Constitutional: She is oriented to person, place, and time. She appears well-developed and well-nourished. No distress.  HENT:  Head: Normocephalic and atraumatic.  Right Ear: External ear normal.  Left Ear: External ear normal.  Mouth/Throat: No oropharyngeal exudate.  Eyes: Conjunctivae and EOM  are normal. Pupils are equal, round, and reactive to light.  Neck: Normal range of motion. Neck supple.  Cardiovascular: Normal rate, regular rhythm and intact distal pulses.   Pulmonary/Chest: Effort normal and breath sounds normal. No respiratory distress. She has no wheezes. She has no rales.  Abdominal: Soft. Bowel sounds are normal. She exhibits no distension. There is no tenderness. There is no rebound.  Musculoskeletal: Normal range of motion.  Neurological: She is alert and oriented to person, place, and time. She has normal reflexes. No cranial nerve deficit.   Skin: Skin is warm and dry.  Psychiatric: She has a normal mood and affect.    ED Course  Procedures (including critical care time) Labs Review Labs Reviewed  CBC WITH DIFFERENTIAL/PLATELET - Abnormal; Notable for the following:    Hemoglobin 9.3 (*)    HCT 29.8 (*)    MCV 65.1 (*)    MCH 20.3 (*)    RDW 18.7 (*)    All other components within normal limits  I-STAT CHEM 8, ED - Abnormal; Notable for the following:    Calcium, Ion 1.11 (*)    Hemoglobin 11.2 (*)    HCT 33.0 (*)    All other components within normal limits  POC URINE PREG, ED    Imaging Review No results found.   EKG Interpretation None      MDM   Final diagnoses:  None     EKG Interpretation  Date/Time:  Thursday August 28 2014 23:59:07 EDT Ventricular Rate:  57 PR Interval:  123 QRS Duration: 96 QT Interval:  416 QTC Calculation: 405 R Axis:   56 Text Interpretation:  Sinus rhythm Confirmed by [facility]  MD, Zeven Kocak ([id number]) on 08/29/2014 2:06:51 AM        Medications  levETIRAcetam (KEPPRA) IVPB 1000 mg/100 mL premix (1,000 mg Intravenous Given 08/29/14 0035)   Will restart keppra.  Patient was told she cannot drive until cleared by neurology.  Patient verbalize understanding that she is not allowed to drive with uncontrolled seizures and must follow up  Arin Vanosdol, MD 08/29/14 [phone]

## 2014-08-29 NOTE — Discharge Instructions (Signed)
Epilepsy People with epilepsy have times when they shake and jerk uncontrollably (seizures). This happens when there is a sudden change in brain function. Epilepsy may have many possible causes. Anything that disturbs the normal pattern of brain cell activity can lead to seizures. HOME CARE   Follow your doctor's instructions about driving and safety during normal activities.  Get enough sleep.  Only take medicine as told by your doctor.  Avoid things that you know can cause you to have seizures (triggers).  Write down when your seizures happen and what you remember about each seizure. Write down anything you think may have caused the seizure to happen.  Tell the people you live and work with that you have seizures. Make sure they know how to help you. They should:  Cushion your head and body.  Turn you on your side.  Not restrain you.  Not place anything inside your mouth.  Call for local emergency medical help if there is any question about what has happened.  Keep all follow-up visits with your doctor. This is very important. GET HELP IF:  You get an infection or start to feel sick. You may have more seizures when you are sick.  You are having seizures more often.  Your seizure pattern is changing. GET HELP RIGHT AWAY IF:   A seizure does not stop after a few seconds or minutes.  A seizure causes you to have trouble breathing.  A seizure gives you a very bad headache.  A seizure makes you unable to speak or use a part of your body. Document Released: 03/20/2009 Document Revised: 03/13/2013 Document Reviewed: 01/02/2013 [facility] Patient Information 2015 [place], [place]. This information is not intended to replace advice given to you by your health care provider. Make sure you discuss any questions you have with your health care provider.  Driving and Equipment Restrictions Some medical problems make it dangerous to drive, ride a bike, or use machines. Some of these  problems are:  A hard blow to the head (concussion).  Passing out (fainting).  Twitching and shaking (seizures).  Low blood sugar.  Taking medicine to help you relax (sedatives).  Taking pain medicines.  Wearing an eye patch.  Wearing splints. This can make it hard to use parts of your body that you need to drive safely. HOME CARE   Do not drive until your doctor says it is okay.  Do not use machines until your doctor says it is okay. You may need a form signed by your doctor (medical release) before you can drive again. You may also need this form before you do other tasks where you need to be fully alert. MAKE SURE YOU:  Understand these instructions.  Will watch your condition.  Will get help right away if you are not doing well or get worse. Document Released: 06/30/2004 Document Revised: 08/15/2011 Document Reviewed: 09/30/2009 [facility] Patient Information 2015 [place], [place]. This information is not intended to replace advice given to you by your health care provider. Make sure you discuss any questions you have with your health care provider.

## 2014-08-30 ENCOUNTER — Emergency Department (HOSPITAL_COMMUNITY)
Admission: EM | Admit: 2014-08-30 | Discharge: 2014-08-30 | Disposition: A | Discharge status: Home / Self Care | Payer: Self-pay | Attending: Emergency Medicine | Admitting: Emergency Medicine

## 2014-08-30 ENCOUNTER — Encounter (HOSPITAL_COMMUNITY): Payer: Self-pay | Admitting: Emergency Medicine

## 2014-08-30 DIAGNOSIS — Z8659 Personal history of other mental and behavioral disorders: Secondary | ICD-10-CM | POA: Insufficient documentation

## 2014-08-30 DIAGNOSIS — Z792 Long term (current) use of antibiotics: Secondary | ICD-10-CM | POA: Insufficient documentation

## 2014-08-30 DIAGNOSIS — R569 Unspecified convulsions: Secondary | ICD-10-CM

## 2014-08-30 DIAGNOSIS — Z3202 Encounter for pregnancy test, result negative: Secondary | ICD-10-CM | POA: Insufficient documentation

## 2014-08-30 DIAGNOSIS — Z79899 Other long term (current) drug therapy: Secondary | ICD-10-CM | POA: Insufficient documentation

## 2014-08-30 DIAGNOSIS — G43909 Migraine, unspecified, not intractable, without status migrainosus: Secondary | ICD-10-CM | POA: Insufficient documentation

## 2014-08-30 DIAGNOSIS — G40909 Epilepsy, unspecified, not intractable, without status epilepticus: Secondary | ICD-10-CM | POA: Insufficient documentation

## 2014-08-30 DIAGNOSIS — Z791 Long term (current) use of non-steroidal anti-inflammatories (NSAID): Secondary | ICD-10-CM | POA: Insufficient documentation

## 2014-08-30 DIAGNOSIS — Z8639 Personal history of other endocrine, nutritional and metabolic disease: Secondary | ICD-10-CM | POA: Insufficient documentation

## 2014-08-30 LAB — CBC
HCT: 31.7 % — ABNORMAL LOW (ref 36.0–46.0)
HEMOGLOBIN: 9.7 g/dL — AB (ref 12.0–15.0)
MCH: 20 pg — AB (ref 26.0–34.0)
MCHC: 30.6 g/dL (ref 30.0–36.0)
MCV: 65.5 fL — ABNORMAL LOW (ref 78.0–100.0)
Platelets: 207 10*3/uL (ref 150–400)
RBC: 4.84 MIL/uL (ref 3.87–5.11)
RDW: 18.6 % — ABNORMAL HIGH (ref 11.5–15.5)
WBC: 4.8 10*3/uL (ref 4.0–10.5)

## 2014-08-30 LAB — BASIC METABOLIC PANEL
ANION GAP: 4 — AB (ref 5–15)
BUN: 14 mg/dL (ref 6–23)
CO2: 26 mmol/L (ref 19–32)
Calcium: 8.7 mg/dL (ref 8.4–10.5)
Chloride: 107 mmol/L (ref 96–112)
Creatinine, Ser: 0.85 mg/dL (ref 0.50–1.10)
GFR calc non Af Amer: >90 mL/min (ref >90)
Glucose, Bld: 91 mg/dL (ref 70–99)
POTASSIUM: 5.1 mmol/L (ref 3.5–5.1)
Sodium: 137 mmol/L (ref 135–145)

## 2014-08-30 LAB — POC URINE PREG, ED: PREG TEST UR: NEGATIVE

## 2014-08-30 MED ORDER — LEVETIRACETAM 500 MG PO TABS: 500.0000 mg | ORAL_TABLET | Freq: Two times a day (BID) | ORAL | Status: DC

## 2014-08-30 MED ORDER — SODIUM CHLORIDE 0.9 % IV BOLUS (SEPSIS)
1000.0000 mL | Freq: Once | INTRAVENOUS | Status: AC
  Administered 2014-08-30: 1000 mL via INTRAVENOUS

## 2014-08-30 MED ORDER — LEVETIRACETAM IN NACL 1000 MG/100ML IV SOLN: 1000.0000 mg | Freq: Once | INTRAVENOUS | Status: DC

## 2014-08-30 MED ORDER — LEVETIRACETAM IN NACL 1000 MG/100ML IV SOLN
1000.0000 mg | Freq: Once | INTRAVENOUS | Status: AC
  Administered 2014-08-30: 1000 mg via INTRAVENOUS
  Filled 2014-08-30: qty 100

## 2014-08-30 NOTE — ED Provider Notes (Signed)
CSN: [id number]     Arrival date & time 08/30/14  1607 History   First MD Initiated Contact with Patient 08/30/14 1610     Chief Complaint  Patient presents with  . Seizures     (Consider location/radiation/quality/duration/timing/severity/associated sxs/prior Treatment) Patient is a 24 y.o. female presenting with seizures. The history is provided by the patient.  Seizures Seizure activity on arrival: no   Seizure type:  Grand mal Preceding symptoms: aura (numbness in feet)   Initial focality:  None Episode characteristics: generalized shaking and stiffening   Postictal symptoms: confusion   Return to baseline: yes   Severity:  Moderate Duration:  2 minutes Timing:  Clustered Number of seizures this episode:  9 PTA treatment:  None History of seizures: yes   Similar to previous episodes: yes   Date of initial seizure episode:  2 years ago Severity:  Moderate Seizure control level:  Uncontrolled Current therapy:  None (can't afford keppra) Compliance with current therapy:  Poor   Past Medical History  Diagnosis Date  . Seizures   . Allergy   . Depression   . Migraines   . Enlarged pituitary gland    Past Surgical History  Procedure Laterality Date  . Appendectomy     Family History  Problem Relation Age of Onset  . Adopted: Yes   History  Substance Use Topics  . Smoking status: Never Smoker   . Smokeless tobacco: Never Used  . Alcohol Use: No   OB History    Gravida Para Term Preterm AB TAB SAB Ectopic Multiple Living   0 0 0 0 0 0 0 0       Review of Systems  Constitutional: Negative for fever.  Respiratory: Negative for cough and shortness of breath.   Cardiovascular: Negative for chest pain and leg swelling.  Gastrointestinal: Negative for vomiting and abdominal pain.  Neurological: Positive for seizures.  All other systems reviewed and are negative.     Allergies  Other  Home Medications   Prior to Admission medications   Medication Sig  Start Date End Date Taking? Authorizing Provider  cephALEXin (KEFLEX) 500 MG capsule Take 1 capsule (500 mg total) by mouth 2 (two) times daily. Patient not taking: Reported on 08/29/2014 06/10/14   Everlene Balls, MD  ibuprofen (ADVIL,MOTRIN) 800 MG tablet Take 1 tablet (800 mg total) by mouth 3 (three) times daily. Patient not taking: Reported on 08/29/2014 08/11/14   Jarrett Soho Muthersbaugh, PA-C  levETIRAcetam (KEPPRA) 500 MG tablet Take 1 tablet (500 mg total) by mouth 2 (two) times daily. Patient not taking: Reported on 08/29/2014 06/10/14   Everlene Balls, MD  levETIRAcetam (KEPPRA) 500 MG tablet Take 1 tablet (500 mg total) by mouth 2 (two) times daily. 08/29/14   April Palumbo, MD  promethazine (PHENERGAN) 25 MG tablet Take 1 tablet (25 mg total) by mouth every 6 (six) hours as needed for nausea or vomiting. Patient not taking: Reported on 08/29/2014 06/10/14   Jarrett Soho Muthersbaugh, PA-C  sucralfate (CARAFATE) 1 G tablet Take 1 tablet (1 g total) by mouth 4 (four) times daily -  with meals and at bedtime. Patient not taking: Reported on 08/29/2014 06/10/14   Jarrett Soho Muthersbaugh, PA-C  topiramate (TOPAMAX) 25 MG tablet Take 1 tablet (25 mg total) by mouth 2 (two) times daily. Patient not taking: Reported on 08/29/2014 06/19/14   Robbie Lis, MD   BP 117/75 mmHg  Pulse 69  Temp(Src) 98.6 F (37 C) (Oral)  Resp 22  SpO2  100%  LMP 08/02/2014 Physical Exam  Constitutional: She is oriented to person, place, and time. She appears well-developed and well-nourished. No distress.  HENT:  Head: Normocephalic and atraumatic.  Mouth/Throat: Oropharynx is clear and moist.  Eyes: EOM are normal. Pupils are equal, round, and reactive to light.  Neck: Normal range of motion. Neck supple.  Cardiovascular: Normal rate and regular rhythm.  Exam reveals no friction rub.   No murmur heard. Pulmonary/Chest: Effort normal and breath sounds normal. No respiratory distress. She has no wheezes. She has no rales.  Abdominal:  Soft. She exhibits no distension. There is no tenderness. There is no rebound.  Musculoskeletal: Normal range of motion. She exhibits no edema.  Neurological: She is alert and oriented to person, place, and time.  Skin: No rash noted. She is not diaphoretic.  Nursing note and vitals reviewed.   ED Course  Procedures (including critical care time) Labs Review Labs Reviewed  CBC  BASIC METABOLIC PANEL    Imaging Review No results found.   EKG Interpretation None      MDM   Final diagnoses:  Seizure    24 year old female here with seizures. Boyfriend stated she had 9 seizures today, stated about 2 minutes a piece. Similar to prior seizures with some torso stiffening and and limb shaking. Has an aura of foot tingling. She is seen yesterday for similar, load with IV Keppra and given Keppra go home, however they're unable to afford her by mouth Keppra as it $75 for 60 pills. Boyfriend stated between seizures she was confused but able to say things like "call my dad." Plan give any meds prior to arrival. Here neurologically intact. We'll follow his neuro about changing up her medication for possible cheaper option. She has seen a Neurologist previously, but stated she wasd never put on any anti-epileptic drugs. Was admitted 2 months ago, had normal EEG at that time. Was started on topamax for migraines, but isn't taking that either. Labs ok. I spoke with Neuro, they recommended continue on Keppra as it is inexpensive. Given GoodRx coupon for Keppra.    Evelina Bucy, MD 08/30/14 [phone]

## 2014-08-30 NOTE — ED Notes (Signed)
Bed: WA02 Expected date: 08/30/14 Expected time: 4:05 PM Means of arrival: Ambulance Comments: Seizures

## 2014-08-30 NOTE — ED Notes (Signed)
Patient here with complaints of 7 seizures today. Prescribed Keppra unable to afford. 99% 2L. Diagnosed with epilepsy 1 month ago.

## 2014-09-23 ENCOUNTER — Encounter (HOSPITAL_COMMUNITY): Payer: Self-pay | Admitting: *Deleted

## 2014-09-23 ENCOUNTER — Emergency Department (HOSPITAL_COMMUNITY)
Admission: EM | Admit: 2014-09-23 | Discharge: 2014-09-23 | Disposition: A | Discharge status: Home / Self Care | Payer: Self-pay | Attending: Emergency Medicine | Admitting: Emergency Medicine

## 2014-09-23 ENCOUNTER — Emergency Department (HOSPITAL_COMMUNITY): Payer: Self-pay

## 2014-09-23 DIAGNOSIS — W01198A Fall on same level from slipping, tripping and stumbling with subsequent striking against other object, initial encounter: Secondary | ICD-10-CM | POA: Insufficient documentation

## 2014-09-23 DIAGNOSIS — Y9389 Activity, other specified: Secondary | ICD-10-CM | POA: Insufficient documentation

## 2014-09-23 DIAGNOSIS — Y92009 Unspecified place in unspecified non-institutional (private) residence as the place of occurrence of the external cause: Secondary | ICD-10-CM | POA: Insufficient documentation

## 2014-09-23 DIAGNOSIS — Z3202 Encounter for pregnancy test, result negative: Secondary | ICD-10-CM | POA: Insufficient documentation

## 2014-09-23 DIAGNOSIS — Y998 Other external cause status: Secondary | ICD-10-CM | POA: Insufficient documentation

## 2014-09-23 DIAGNOSIS — Z792 Long term (current) use of antibiotics: Secondary | ICD-10-CM | POA: Insufficient documentation

## 2014-09-23 DIAGNOSIS — G43909 Migraine, unspecified, not intractable, without status migrainosus: Secondary | ICD-10-CM | POA: Insufficient documentation

## 2014-09-23 DIAGNOSIS — S199XXA Unspecified injury of neck, initial encounter: Secondary | ICD-10-CM | POA: Insufficient documentation

## 2014-09-23 DIAGNOSIS — F329 Major depressive disorder, single episode, unspecified: Secondary | ICD-10-CM | POA: Insufficient documentation

## 2014-09-23 DIAGNOSIS — Z79899 Other long term (current) drug therapy: Secondary | ICD-10-CM | POA: Insufficient documentation

## 2014-09-23 DIAGNOSIS — S0990XA Unspecified injury of head, initial encounter: Secondary | ICD-10-CM | POA: Insufficient documentation

## 2014-09-23 DIAGNOSIS — R569 Unspecified convulsions: Secondary | ICD-10-CM

## 2014-09-23 DIAGNOSIS — Z791 Long term (current) use of non-steroidal anti-inflammatories (NSAID): Secondary | ICD-10-CM | POA: Insufficient documentation

## 2014-09-23 DIAGNOSIS — G40909 Epilepsy, unspecified, not intractable, without status epilepticus: Secondary | ICD-10-CM | POA: Insufficient documentation

## 2014-09-23 LAB — POC URINE PREG, ED: PREG TEST UR: NEGATIVE

## 2014-09-23 NOTE — Discharge Instructions (Signed)
xrays are normal - take your Keppra in the morning and at night.  Please call your doctor for a followup appointment within 24-48 hours. When you talk to your doctor please let them know that you were seen in the emergency department and have them acquire all of your records so that they can discuss the findings with you and formulate a treatment plan to fully care for your new and ongoing problems.

## 2014-09-23 NOTE — ED Provider Notes (Signed)
CSN: [id number]     Arrival date & time 09/23/14  1541 History   First MD Initiated Contact with Patient 09/23/14 1552     Chief Complaint  Patient presents with  . Seizures     (Consider location/radiation/quality/duration/timing/severity/associated sxs/prior Treatment) HPI Comments: The patient presents to the hospital by ambulance after she had a witnessed seizure at home on her way to work. She was getting ready to leave, she had acute onset of tonic-clonic seizure, fell to the ground striking her head, seizure persisted for several minutes, the patient had episodes witnessed by family members where she asked for them to call an ambulance while she was having a seizure, the paramedics witnessed more seizure activity when they came to transport her during which time they describe her kicking her legs and nodding her head, on arrival the patient is able to give me a very clear history, states that she does not remember anything regarding the seizure but has had a general lack of appetite over the last couple of months, reports I explained weight loss, increasing depression, denies suicidality. At this time her only complaint is neck pain, this is bilateral in nature, not spinal and has no numbness weakness change in vision chest pain abdominal pain coughing shortness of breath swelling or rashes. She reports not taking her seizure medication today, she reports that she was going to take it when she got to work. She reports being diagnosed with epilepsy approximately a month ago at the same time she was started on Keppra, prior to that she has had 1 year of seizures during which she has tried different medications, could not afford the [place] which she was recently able to fill.  Patient is a 24 y.o. female presenting with seizures. The history is provided by the patient.  Seizures   Past Medical History  Diagnosis Date  . Seizures   . Allergy   . Depression   . Migraines   . Enlarged  pituitary gland    Past Surgical History  Procedure Laterality Date  . Appendectomy     Family History  Problem Relation Age of Onset  . Adopted: Yes   History  Substance Use Topics  . Smoking status: Never Smoker   . Smokeless tobacco: Never Used  . Alcohol Use: No   OB History    Gravida Para Term Preterm AB TAB SAB Ectopic Multiple Living   0 0 0 0 0 0 0 0       Review of Systems  Neurological: Positive for seizures.  All other systems reviewed and are negative.     Allergies  Other  Home Medications   Prior to Admission medications   Medication Sig Start Date End Date Taking? Authorizing Provider  cephALEXin (KEFLEX) 500 MG capsule Take 1 capsule (500 mg total) by mouth 2 (two) times daily. Patient not taking: Reported on 08/29/2014 06/10/14   Everlene Balls, MD  ibuprofen (ADVIL,MOTRIN) 800 MG tablet Take 1 tablet (800 mg total) by mouth 3 (three) times daily. Patient not taking: Reported on 08/29/2014 08/11/14   Jarrett Soho Muthersbaugh, PA-C  levETIRAcetam (KEPPRA) 500 MG tablet Take 1 tablet (500 mg total) by mouth 2 (two) times daily. Patient not taking: Reported on 08/29/2014 06/10/14   Everlene Balls, MD  levETIRAcetam (KEPPRA) 500 MG tablet Take 1 tablet (500 mg total) by mouth 2 (two) times daily. 08/29/14   April Palumbo, MD  levETIRAcetam (KEPPRA) 500 MG tablet Take 1 tablet (500 mg total) by mouth 2 (  two) times daily. 08/30/14   Evelina Bucy, MD  promethazine (PHENERGAN) 25 MG tablet Take 1 tablet (25 mg total) by mouth every 6 (six) hours as needed for nausea or vomiting. Patient not taking: Reported on 08/29/2014 06/10/14   Jarrett Soho Muthersbaugh, PA-C  sucralfate (CARAFATE) 1 G tablet Take 1 tablet (1 g total) by mouth 4 (four) times daily -  with meals and at bedtime. Patient not taking: Reported on 08/29/2014 06/10/14   Jarrett Soho Muthersbaugh, PA-C  topiramate (TOPAMAX) 25 MG tablet Take 1 tablet (25 mg total) by mouth 2 (two) times daily. Patient not taking: Reported on 08/29/2014  06/19/14   Robbie Lis, MD   BP 116/66 mmHg  Pulse 87  Temp(Src) 98.2 F (36.8 C) (Oral)  Resp 18  Ht 6\' 1"  (1.854 m)  Wt 150 lb (68.04 kg)  BMI 19.79 kg/m2  SpO2 99% Physical Exam  Constitutional: She appears well-developed and well-nourished. No distress.  HENT:  Head: Normocephalic and atraumatic.  Mouth/Throat: Oropharynx is clear and moist. No oropharyngeal exudate.  No hemotympanum, no malocclusion, no raccoon eyes, no battle sign, no tenderness over the facial bones, no hematoma of the scalp  Eyes: Conjunctivae and EOM are normal. Pupils are equal, round, and reactive to light. Right eye exhibits no discharge. Left eye exhibits no discharge. No scleral icterus.  Neck: No JVD present. No thyromegaly present.  Cardiovascular: Normal rate, regular rhythm, normal heart sounds and intact distal pulses.  Exam reveals no gallop and no friction rub.   No murmur heard. Pulmonary/Chest: Effort normal and breath sounds normal. No respiratory distress. She has no wheezes. She has no rales.  Abdominal: Soft. Bowel sounds are normal. She exhibits no distension and no mass. There is no tenderness.  Musculoskeletal: Normal range of motion. She exhibits no edema or tenderness.  Supple joints, soft compartments, tenderness in the bilateral neck muscles, no spinal tenderness of the cervical thoracic or lumbar spines  Lymphadenopathy:    She has no cervical adenopathy.  Neurological: She is alert. Coordination normal.  The patient is able to move all 4 extremities, follows commands, has very clear speech, has normal memory outside of the event of the seizures,  Skin: Skin is warm and dry. No rash noted. No erythema.  Psychiatric: She has a normal mood and affect. Her behavior is normal.  Nursing note and vitals reviewed.   ED Course  Procedures (including critical care time) Labs Review Labs Reviewed - No data to display  Imaging Review No results found.    MDM   Final diagnoses:   Head injury  Head injury    By description of prehospital personnel and family members this may not of discharge tonic-clonic seizure activity, there is no evidence of tongue biting or urinary incontinence, her significant other who is at the bedside with her states that she has been under a significant amount of stress raising the possibility of pseudoseizures wherever given her head injury and neck pain will image for fracture or intracranial injury, the patient has her own medication here, she will take a dose of her Keppra, observational period in the emergency department.  No furtner seizure like activity - has maintained a normal MS - she has neg preg and neg imaging of her brain / spine.  Stable for d/c - pt took and tolerated her home meds while in ED  Noemi Chapel, MD 09/24/14 1130

## 2014-09-23 NOTE — ED Notes (Signed)
Per EMS pt coming from home with c/o multiple seizures witnessed by her roommates. Per EMS pt's roommate stated that pt asked him to call an ambulance during one of the seizures. EMS sts pt was A+O x 4, and while talking to them she had 2 more episodes of "seizure like activity" during which they observed pt kick her legs and nod her head. Pt is A+O on arrival c/o neck pain due to fall during one of her seizures. C-collar applied by ems.

## 2014-09-23 NOTE — ED Notes (Signed)
Bed: WA05 Expected date:  Expected time:  Means of arrival:  Comments: EMS-SZ 

## 2014-09-23 NOTE — ED Notes (Signed)
Pt observed taking her home medication Andres Labrum) which was approved by Dr Sabra Heck. Pt's boyfriend is at the bedside and there seems to be some arguing between him and pt. Pt was observed crying and when asked if she was okay her boyfriend responds "she is going in depression". He appears hostile, rocking back and forth on the chair. Pt instructed to call for help if she needs anything. She voiced understanding.

## 2014-09-23 NOTE — ED Notes (Signed)
Pt denied questions concerns r/t dc. Pt encouraged to f/u with a pcp of her choice. Pt a&ox4 ambulatory at time of dc. Bus pass given

## 2014-10-08 ENCOUNTER — Emergency Department (HOSPITAL_COMMUNITY)
Admission: EM | Admit: 2014-10-08 | Discharge: 2014-10-08 | Disposition: A | Discharge status: Home / Self Care | Payer: Medicaid Other | Attending: Emergency Medicine | Admitting: Emergency Medicine

## 2014-10-08 ENCOUNTER — Encounter (HOSPITAL_COMMUNITY): Payer: Self-pay

## 2014-10-08 DIAGNOSIS — Z792 Long term (current) use of antibiotics: Secondary | ICD-10-CM | POA: Insufficient documentation

## 2014-10-08 DIAGNOSIS — G40909 Epilepsy, unspecified, not intractable, without status epilepticus: Secondary | ICD-10-CM | POA: Insufficient documentation

## 2014-10-08 DIAGNOSIS — Z8659 Personal history of other mental and behavioral disorders: Secondary | ICD-10-CM | POA: Insufficient documentation

## 2014-10-08 DIAGNOSIS — Z3202 Encounter for pregnancy test, result negative: Secondary | ICD-10-CM | POA: Insufficient documentation

## 2014-10-08 DIAGNOSIS — Z79899 Other long term (current) drug therapy: Secondary | ICD-10-CM | POA: Insufficient documentation

## 2014-10-08 DIAGNOSIS — G43809 Other migraine, not intractable, without status migrainosus: Secondary | ICD-10-CM | POA: Insufficient documentation

## 2014-10-08 LAB — COMPREHENSIVE METABOLIC PANEL
ALT: 13 U/L — AB (ref 14–54)
ANION GAP: 7 (ref 5–15)
AST: 19 U/L (ref 15–41)
Albumin: 4.4 g/dL (ref 3.5–5.0)
Alkaline Phosphatase: 52 U/L (ref 38–126)
BUN: 15 mg/dL (ref 6–20)
CO2: 25 mmol/L (ref 22–32)
Calcium: 9.1 mg/dL (ref 8.9–10.3)
Chloride: 104 mmol/L (ref 101–111)
Creatinine, Ser: 0.79 mg/dL (ref 0.44–1.00)
GFR calc non Af Amer: >60 mL/min (ref >60)
GLUCOSE: 96 mg/dL (ref 70–99)
Potassium: 4 mmol/L (ref 3.5–5.1)
SODIUM: 136 mmol/L (ref 135–145)
TOTAL PROTEIN: 8.4 g/dL — AB (ref 6.5–8.1)
Total Bilirubin: 1.7 mg/dL — ABNORMAL HIGH (ref 0.3–1.2)

## 2014-10-08 LAB — POC URINE PREG, ED: Preg Test, Ur: NEGATIVE

## 2014-10-08 LAB — CBC WITH DIFFERENTIAL/PLATELET
Basophils Absolute: 0 10*3/uL (ref 0.0–0.1)
Basophils Relative: 0 % (ref 0–1)
Eosinophils Absolute: 0.1 10*3/uL (ref 0.0–0.7)
Eosinophils Relative: 3 % (ref 0–5)
HEMATOCRIT: 35.2 % — AB (ref 36.0–46.0)
Hemoglobin: 11 g/dL — ABNORMAL LOW (ref 12.0–15.0)
LYMPHS ABS: 1.9 10*3/uL (ref 0.7–4.0)
Lymphocytes Relative: 43 % (ref 12–46)
MCH: 20.2 pg — AB (ref 26.0–34.0)
MCHC: 31.3 g/dL (ref 30.0–36.0)
MCV: 64.6 fL — ABNORMAL LOW (ref 78.0–100.0)
MONO ABS: 0.3 10*3/uL (ref 0.1–1.0)
Monocytes Relative: 6 % (ref 3–12)
NRBC: 0 /100{WBCs}
Neutro Abs: 2.1 10*3/uL (ref 1.7–7.7)
Neutrophils Relative %: 48 % (ref 43–77)
Platelets: 222 10*3/uL (ref 150–400)
RBC: 5.45 MIL/uL — ABNORMAL HIGH (ref 3.87–5.11)
RDW: 18.6 % — ABNORMAL HIGH (ref 11.5–15.5)
WBC: 4.4 10*3/uL (ref 4.0–10.5)

## 2014-10-08 LAB — URINALYSIS, ROUTINE W REFLEX MICROSCOPIC
BILIRUBIN URINE: NEGATIVE
Glucose, UA: NEGATIVE mg/dL
HGB URINE DIPSTICK: NEGATIVE
KETONES UR: NEGATIVE mg/dL
Leukocytes, UA: NEGATIVE
Nitrite: NEGATIVE
Protein, ur: NEGATIVE mg/dL
Specific Gravity, Urine: 1.022 (ref 1.005–1.030)
UROBILINOGEN UA: 0.2 mg/dL (ref 0.0–1.0)
pH: 8 (ref 5.0–8.0)

## 2014-10-08 MED ORDER — LEVETIRACETAM 500 MG PO TABS: 500.0000 mg | ORAL_TABLET | Freq: Two times a day (BID) | ORAL | Status: DC

## 2014-10-08 MED ORDER — DIPHENHYDRAMINE HCL 50 MG/ML IJ SOLN
25.0000 mg | Freq: Once | INTRAMUSCULAR | Status: AC
  Administered 2014-10-08: 25 mg via INTRAVENOUS
  Filled 2014-10-08: qty 1

## 2014-10-08 MED ORDER — KETOROLAC TROMETHAMINE 30 MG/ML IJ SOLN
30.0000 mg | Freq: Once | INTRAMUSCULAR | Status: AC
  Administered 2014-10-08: 30 mg via INTRAVENOUS
  Filled 2014-10-08: qty 1

## 2014-10-08 MED ORDER — SODIUM CHLORIDE 0.9 % IV SOLN
1000.0000 mg | Freq: Once | INTRAVENOUS | Status: AC
  Administered 2014-10-08: 1000 mg via INTRAVENOUS
  Filled 2014-10-08: qty 10

## 2014-10-08 MED ORDER — METOCLOPRAMIDE HCL 5 MG/ML IJ SOLN
10.0000 mg | Freq: Once | INTRAMUSCULAR | Status: AC
  Administered 2014-10-08: 10 mg via INTRAVENOUS
  Filled 2014-10-08: qty 2

## 2014-10-08 NOTE — ED Provider Notes (Signed)
CSN: [id number]     Arrival date & time 10/08/14  1241 History   First MD Initiated Contact with Patient 10/08/14 1353     Chief Complaint  Patient presents with  . Headache  . Emesis  . Ear Bleeding      (Consider location/radiation/quality/duration/timing/severity/associated sxs/prior Treatment) HPI Comments: Patient with a history of Migraines and Seizure Disorder presents today with complaints of headache, vomiting, and bleeding of her right ear.  She states that she has had migraine headaches intermittently over the past year.  This headache has been constant since yesterday and was gradual in onset.  She reports that the headache feels like her typical migraine.   She states that the headache is located in the posterior portion of the head, which is where her migraines typically occur.  She has not taken anything for her symptoms prior to arrival.  She reports that she had two episodes of vomiting yesterday, but no vomiting today.  She reports that her right ear was bleeding three weeks ago, but no bleeding since that time.  She denies vision changes, nuchal rigidity, fever, chills, dizziness, or syncope, numbness, tingling, or focal weakness.  Patient has a history of Seizure disorder and reports that she has been off of the Lyman for the past week because she ran out.  She is requesting a refill of the Keppra.  She denies any recent seizure activity.  Patient is a 24 y.o. female presenting with headaches and vomiting. The history is provided by the patient.  Headache Associated symptoms: vomiting   Emesis Associated symptoms: headaches     Past Medical History  Diagnosis Date  . Seizures   . Allergy   . Depression   . Migraines   . Enlarged pituitary gland    Past Surgical History  Procedure Laterality Date  . Appendectomy     Family History  Problem Relation Age of Onset  . Adopted: Yes   History  Substance Use Topics  . Smoking status: Never Smoker   . Smokeless  tobacco: Never Used  . Alcohol Use: No   OB History    Gravida Para Term Preterm AB TAB SAB Ectopic Multiple Living   0 0 0 0 0 0 0 0       Review of Systems  Gastrointestinal: Positive for vomiting.  Neurological: Positive for headaches.  All other systems reviewed and are negative.     Allergies  Other  Home Medications   Prior to Admission medications   Medication Sig Start Date End Date Taking? Authorizing Provider  levETIRAcetam (KEPPRA) 500 MG tablet Take 500 mg by mouth 2 (two) times daily.   Yes Historical Provider, MD  cephALEXin (KEFLEX) 500 MG capsule Take 1 capsule (500 mg total) by mouth 2 (two) times daily. Patient not taking: Reported on 08/29/2014 06/10/14   Everlene Balls, MD  ibuprofen (ADVIL,MOTRIN) 800 MG tablet Take 1 tablet (800 mg total) by mouth 3 (three) times daily. Patient not taking: Reported on 08/29/2014 08/11/14   Jarrett Soho Muthersbaugh, PA-C  levETIRAcetam (KEPPRA) 500 MG tablet Take 1 tablet (500 mg total) by mouth 2 (two) times daily. Patient not taking: Reported on 08/29/2014 06/10/14   Everlene Balls, MD  levETIRAcetam (KEPPRA) 500 MG tablet Take 1 tablet (500 mg total) by mouth 2 (two) times daily. 08/29/14   April Palumbo, MD  levETIRAcetam (KEPPRA) 500 MG tablet Take 1 tablet (500 mg total) by mouth 2 (two) times daily. Patient not taking: Reported on 09/23/2014 08/30/14  Evelina Bucy, MD  promethazine (PHENERGAN) 25 MG tablet Take 1 tablet (25 mg total) by mouth every 6 (six) hours as needed for nausea or vomiting. Patient not taking: Reported on 08/29/2014 06/10/14   Jarrett Soho Muthersbaugh, PA-C  sucralfate (CARAFATE) 1 G tablet Take 1 tablet (1 g total) by mouth 4 (four) times daily -  with meals and at bedtime. Patient not taking: Reported on 08/29/2014 06/10/14   Jarrett Soho Muthersbaugh, PA-C  topiramate (TOPAMAX) 25 MG tablet Take 1 tablet (25 mg total) by mouth 2 (two) times daily. Patient not taking: Reported on 08/29/2014 06/19/14   Robbie Lis, MD   BP 108/57  mmHg  Pulse 67  Temp(Src) 98.2 F (36.8 C) (Oral)  Resp 16  SpO2 100%  LMP 09/22/2014 Physical Exam  Constitutional: She appears well-developed and well-nourished.  HENT:  Head: Normocephalic and atraumatic.  Mouth/Throat: Oropharynx is clear and moist.  Eyes: EOM are normal. Pupils are equal, round, and reactive to light.  Neck: Normal range of motion. Neck supple.  Cardiovascular: Normal rate, regular rhythm and normal heart sounds.   Pulmonary/Chest: Effort normal and breath sounds normal.  Abdominal: Soft. Bowel sounds are normal. She exhibits no distension and no mass. There is no tenderness. There is no rebound and no guarding.  Musculoskeletal: Normal range of motion.  Neurological: She is alert. She has normal strength. No cranial nerve deficit or sensory deficit. Coordination and gait normal.  Normal finger to nose testing Normal rapid alternating movements  Normal gait, no ataxia.  Skin: Skin is warm and dry.  Psychiatric: She has a normal mood and affect.  Nursing note and vitals reviewed.   ED Course  Procedures (including critical care time) Labs Review Labs Reviewed  CBC WITH DIFFERENTIAL/PLATELET - Abnormal; Notable for the following:    RBC 5.45 (*)    Hemoglobin 11.0 (*)    HCT 35.2 (*)    MCV 64.6 (*)    MCH 20.2 (*)    RDW 18.6 (*)    All other components within normal limits  URINALYSIS, ROUTINE W REFLEX MICROSCOPIC - Abnormal; Notable for the following:    APPearance CLOUDY (*)    All other components within normal limits  COMPREHENSIVE METABOLIC PANEL  POC URINE PREG, ED    Imaging Review No results found.   EKG Interpretation None     5:25 PM Reassessed patient.  She reports that her headache has improved at this time.  Patient tolerating PO liquids. MDM   Final diagnoses:  None   Patient with a history of Migraine presents today with a chief complaint of headache.  Pt HA treated and improved while in ED.  Presentation is like pts  typical HA and non concerning for [facility], ICH, Meningitis, or temporal arteritis. Pt is afebrile with no focal neuro deficits, nuchal rigidity, or change in vision.  Labs ordered at Triage are unremarkable.  Pt is to follow up with PCP to discuss prophylactic medication. Pt verbalizes understanding and is agreeable with plan to dc. Patient also given refill of her Keppra.  Return precautions given.     Hyman Bible, PA-C 10/08/14 [place], PA-C 10/08/14 1734  Malvin Johns, MD 10/08/14 2306

## 2014-10-08 NOTE — ED Notes (Signed)
Pt c/o headache, emesis, and intermittent R ear bleeding x 1 week.  Pain score 10/10.  Pt has not taken anything for pain, but has been taking all medications as prescribed.  Pt reports falling x 2 weeks ago and hitting head after a seizure.  Pt was seen at [facility] and discharged.  Head CT negative.

## 2014-10-28 ENCOUNTER — Emergency Department (HOSPITAL_COMMUNITY): Payer: Medicaid Other

## 2014-10-28 ENCOUNTER — Emergency Department (HOSPITAL_COMMUNITY)
Admission: EM | Admit: 2014-10-28 | Discharge: 2014-10-28 | Disposition: A | Discharge status: Home / Self Care | Payer: Self-pay | Attending: Emergency Medicine | Admitting: Emergency Medicine

## 2014-10-28 ENCOUNTER — Encounter (HOSPITAL_COMMUNITY): Payer: Self-pay | Admitting: Emergency Medicine

## 2014-10-28 DIAGNOSIS — Z9119 Patient's noncompliance with other medical treatment and regimen: Secondary | ICD-10-CM | POA: Insufficient documentation

## 2014-10-28 DIAGNOSIS — G40909 Epilepsy, unspecified, not intractable, without status epilepticus: Secondary | ICD-10-CM | POA: Insufficient documentation

## 2014-10-28 DIAGNOSIS — F329 Major depressive disorder, single episode, unspecified: Secondary | ICD-10-CM | POA: Insufficient documentation

## 2014-10-28 DIAGNOSIS — Z791 Long term (current) use of non-steroidal anti-inflammatories (NSAID): Secondary | ICD-10-CM | POA: Insufficient documentation

## 2014-10-28 DIAGNOSIS — R569 Unspecified convulsions: Secondary | ICD-10-CM

## 2014-10-28 DIAGNOSIS — Y9389 Activity, other specified: Secondary | ICD-10-CM | POA: Insufficient documentation

## 2014-10-28 DIAGNOSIS — W01198A Fall on same level from slipping, tripping and stumbling with subsequent striking against other object, initial encounter: Secondary | ICD-10-CM | POA: Insufficient documentation

## 2014-10-28 DIAGNOSIS — Y990 Civilian activity done for income or pay: Secondary | ICD-10-CM | POA: Insufficient documentation

## 2014-10-28 DIAGNOSIS — Z8639 Personal history of other endocrine, nutritional and metabolic disease: Secondary | ICD-10-CM | POA: Insufficient documentation

## 2014-10-28 DIAGNOSIS — Z79899 Other long term (current) drug therapy: Secondary | ICD-10-CM | POA: Insufficient documentation

## 2014-10-28 DIAGNOSIS — Z792 Long term (current) use of antibiotics: Secondary | ICD-10-CM | POA: Insufficient documentation

## 2014-10-28 DIAGNOSIS — G43909 Migraine, unspecified, not intractable, without status migrainosus: Secondary | ICD-10-CM | POA: Insufficient documentation

## 2014-10-28 DIAGNOSIS — R55 Syncope and collapse: Secondary | ICD-10-CM | POA: Insufficient documentation

## 2014-10-28 DIAGNOSIS — Y9289 Other specified places as the place of occurrence of the external cause: Secondary | ICD-10-CM | POA: Insufficient documentation

## 2014-10-28 LAB — URINALYSIS, ROUTINE W REFLEX MICROSCOPIC
Bilirubin Urine: NEGATIVE
Glucose, UA: NEGATIVE mg/dL
Hgb urine dipstick: NEGATIVE
Ketones, ur: NEGATIVE mg/dL
Leukocytes, UA: NEGATIVE
NITRITE: NEGATIVE
Protein, ur: NEGATIVE mg/dL
Specific Gravity, Urine: 1.011 (ref 1.005–1.030)
UROBILINOGEN UA: 0.2 mg/dL (ref 0.0–1.0)
pH: 7 (ref 5.0–8.0)

## 2014-10-28 LAB — BASIC METABOLIC PANEL
Anion gap: 8 (ref 5–15)
BUN: 15 mg/dL (ref 6–20)
CO2: 27 mmol/L (ref 22–32)
Calcium: 9.1 mg/dL (ref 8.9–10.3)
Chloride: 103 mmol/L (ref 101–111)
Creatinine, Ser: 0.86 mg/dL (ref 0.44–1.00)
GFR calc Af Amer: >60 mL/min (ref >60)
GFR calc non Af Amer: >60 mL/min (ref >60)
Glucose, Bld: 93 mg/dL (ref 65–99)
Potassium: 4.1 mmol/L (ref 3.5–5.1)
SODIUM: 138 mmol/L (ref 135–145)

## 2014-10-28 LAB — CBC WITH DIFFERENTIAL/PLATELET
Basophils Absolute: 0 10*3/uL (ref 0.0–0.1)
Basophils Relative: 0 % (ref 0–1)
Eosinophils Absolute: 0.1 10*3/uL (ref 0.0–0.7)
Eosinophils Relative: 1 % (ref 0–5)
HEMATOCRIT: 33.8 % — AB (ref 36.0–46.0)
HEMOGLOBIN: 10.5 g/dL — AB (ref 12.0–15.0)
LYMPHS ABS: 1.3 10*3/uL (ref 0.7–4.0)
Lymphocytes Relative: 24 % (ref 12–46)
MCH: 20.2 pg — ABNORMAL LOW (ref 26.0–34.0)
MCHC: 31.1 g/dL (ref 30.0–36.0)
MCV: 65.1 fL — ABNORMAL LOW (ref 78.0–100.0)
Monocytes Absolute: 0.4 10*3/uL (ref 0.1–1.0)
Monocytes Relative: 7 % (ref 3–12)
Neutro Abs: 3.6 10*3/uL (ref 1.7–7.7)
Neutrophils Relative %: 68 % (ref 43–77)
PLATELETS: 238 10*3/uL (ref 150–400)
RBC: 5.19 MIL/uL — ABNORMAL HIGH (ref 3.87–5.11)
RDW: 18.4 % — AB (ref 11.5–15.5)
WBC: 5.4 10*3/uL (ref 4.0–10.5)

## 2014-10-28 IMAGING — CT CT HEAD W/O CM
2 series · 17 of 30 positions shown, 20 images · non-contrast
Comparison: CT head [DATE]

CLINICAL DATA: Seizure today.  Fell and hit head

EXAM:
CT HEAD WITHOUT CONTRAST
TECHNIQUE: Contiguous axial images were obtained from the base of the skull
through the vertex without intravenous contrast.

[Series 2: head w/o · axial · non-contrast · 0.42mm/px · z∈[-107,+13]mm · 9 of 32 slices shown, 12 images]
[im 4/32  brain]
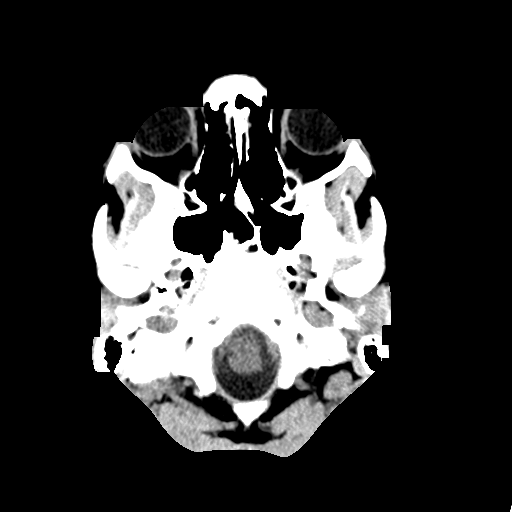
[im 4/32  bone]
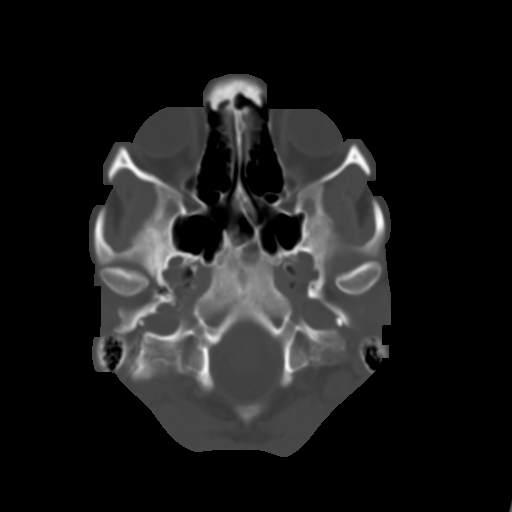
[im 7/32  brain]
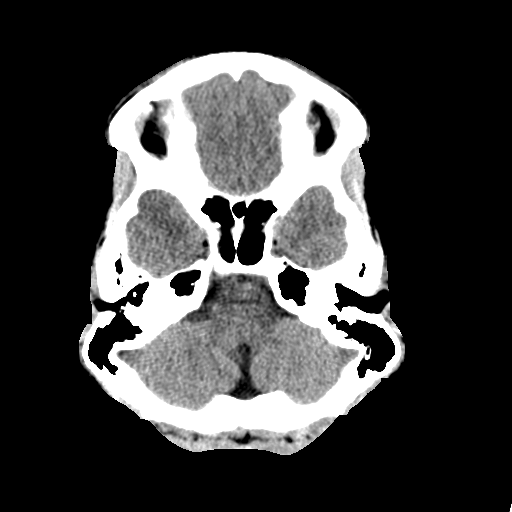
[im 10/32  brain]
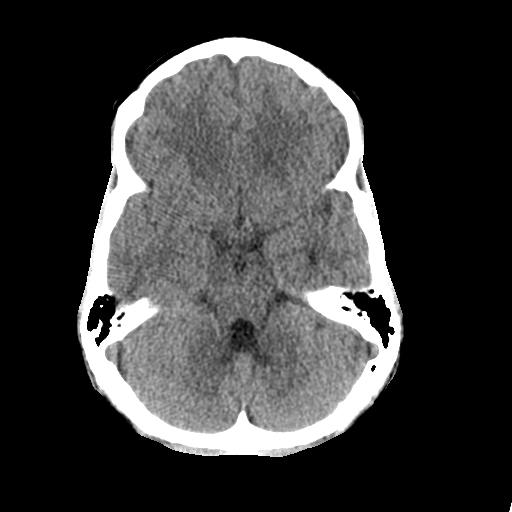
[im 13/32  brain]
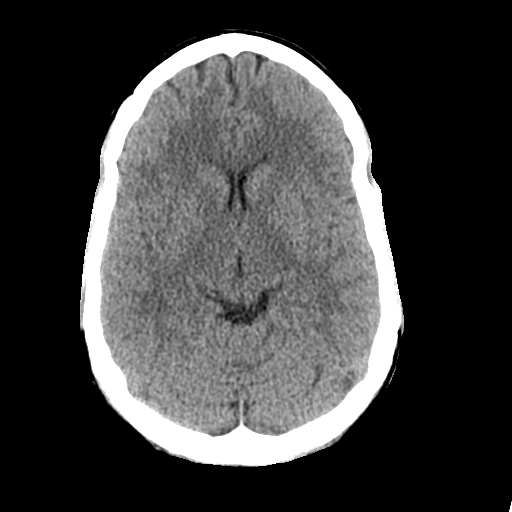
[im 16/32  brain]
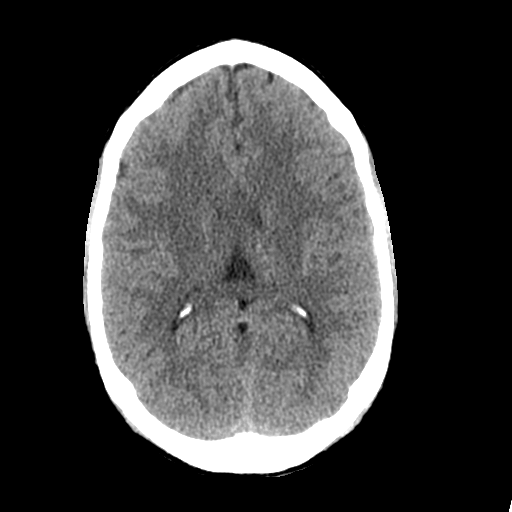
[im 16/32  bone]
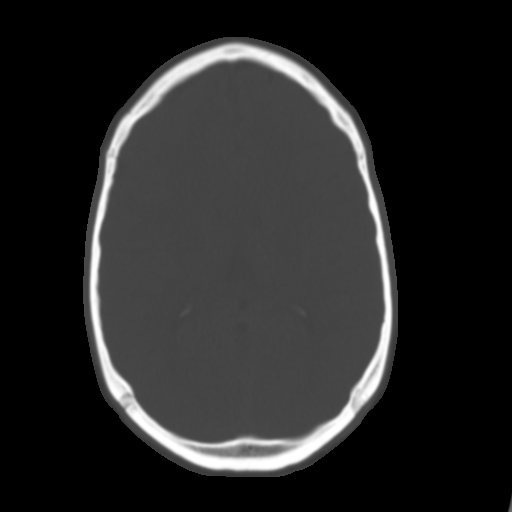
[im 19/32  brain]
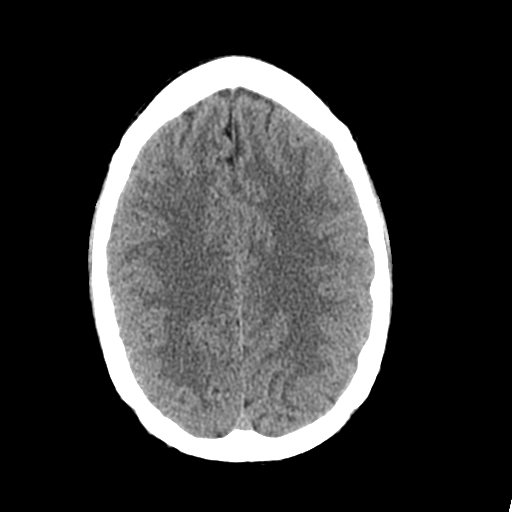
[im 22/32  brain]
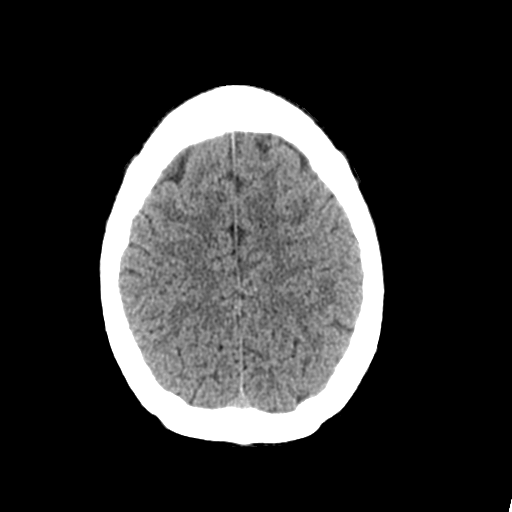
[im 25/32  brain]
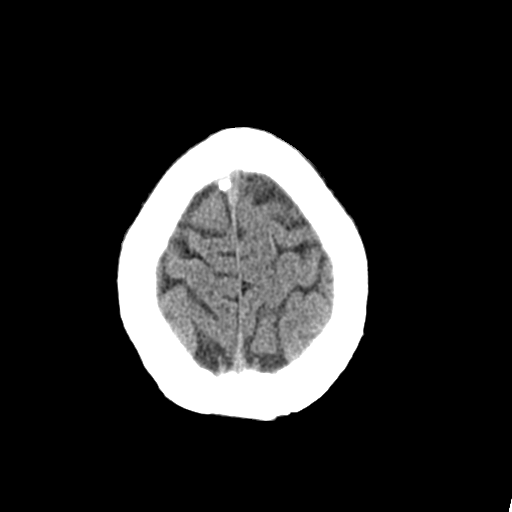
[im 28/32  brain]
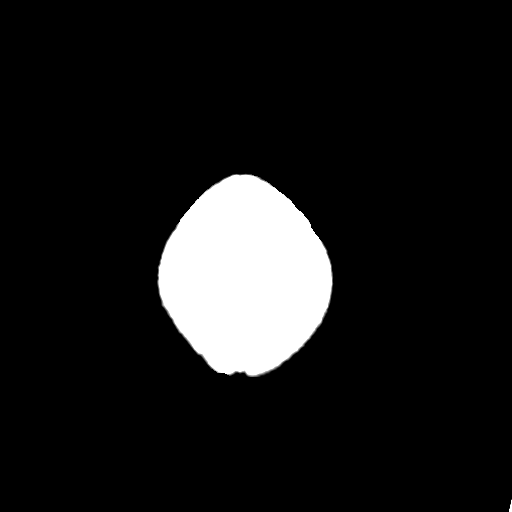
[im 28/32  bone]
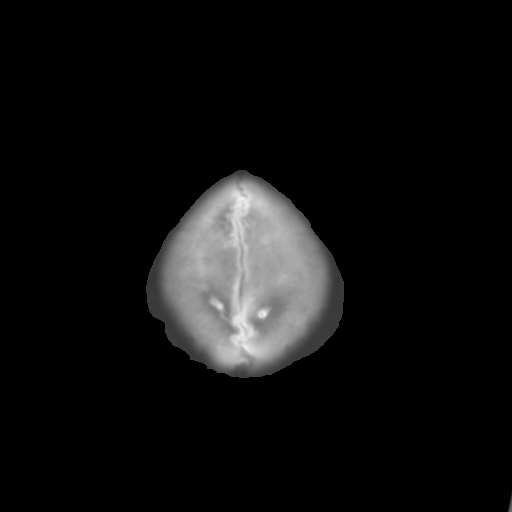

[Series 3: bone windows · axial · 0.42mm/px · z∈[-107,+16]mm · 8 of 53 slices shown]
[im 6/53  bone]
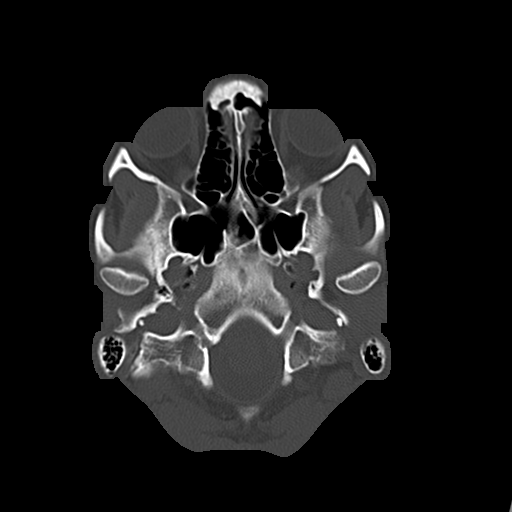
[im 12/53  bone]
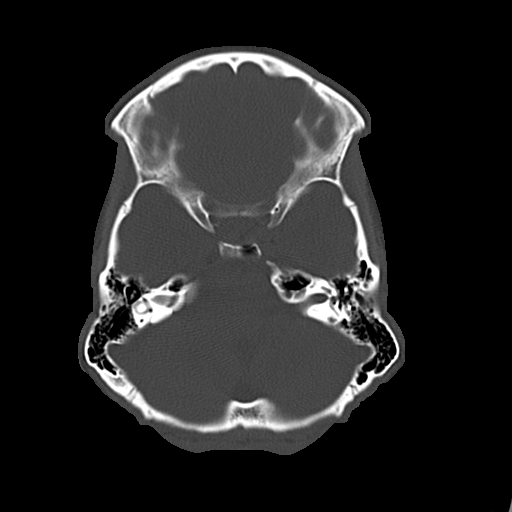
[im 18/53  bone]
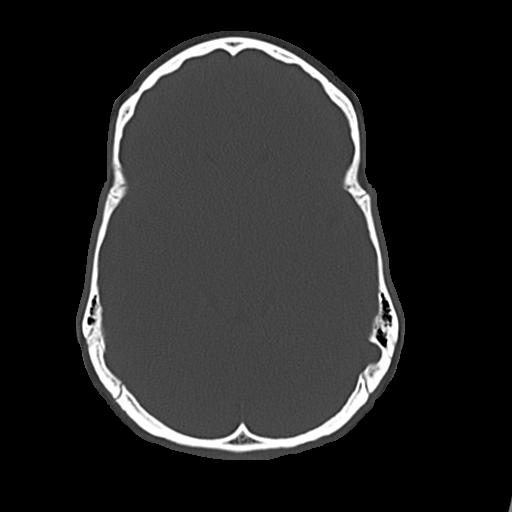
[im 24/53  bone]
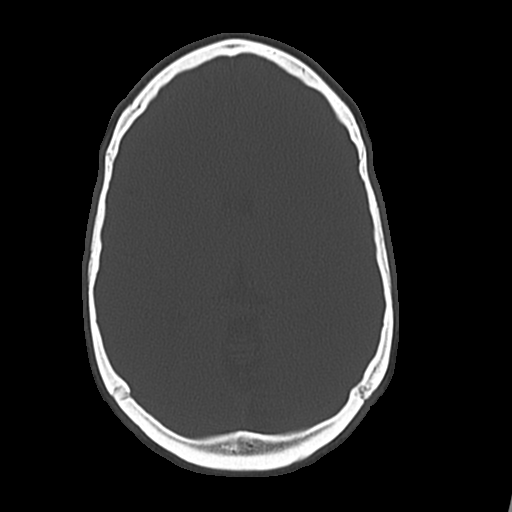
[im 29/53  bone]
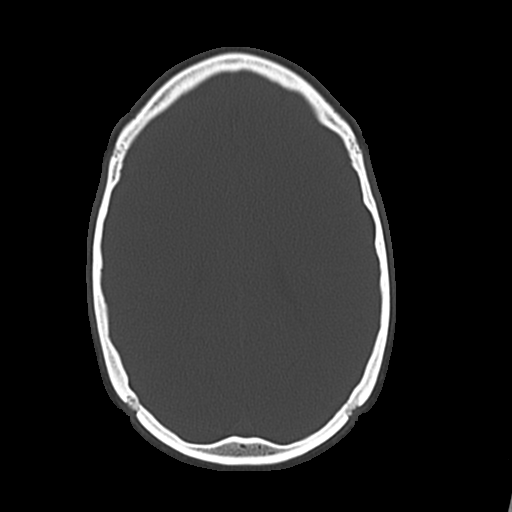
[im 35/53  bone]
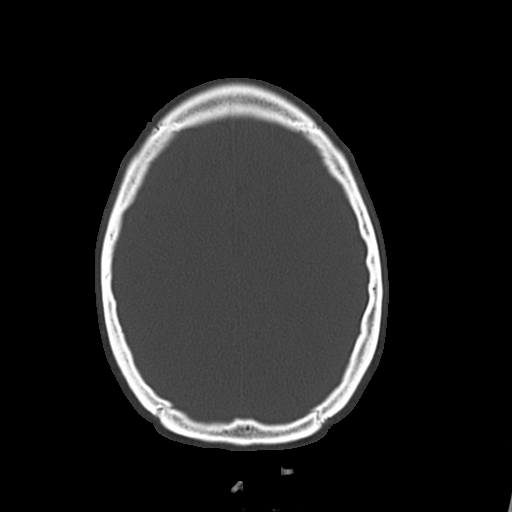
[im 41/53  bone]
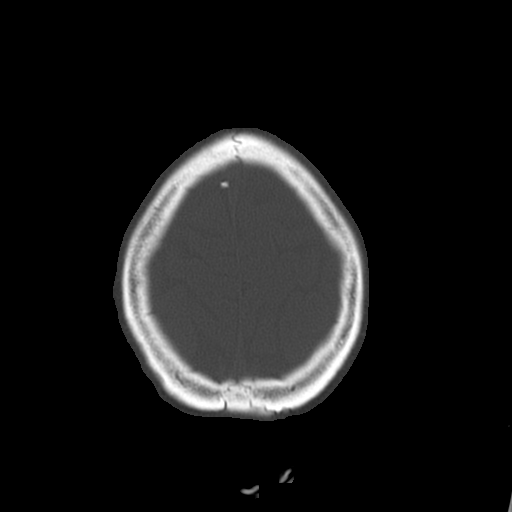
[im 47/53  bone]
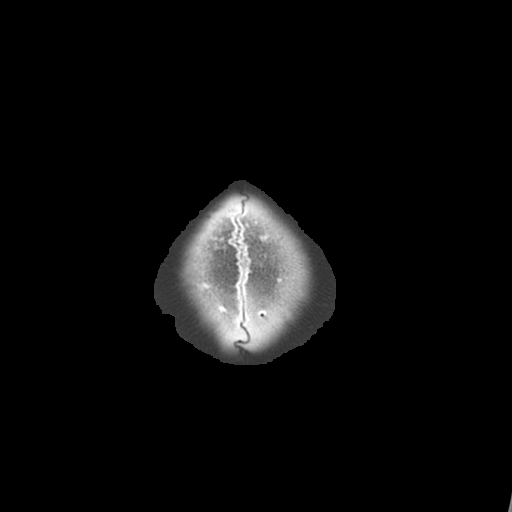

[17 of 30 positions shown; findings below may reference images not displayed]

FINDINGS: Ventricle size is normal. Negative for acute or chronic infarction.
Negative for hemorrhage or fluid collection. Negative for mass or
edema. No shift of the midline structures.

Calvarium is intact.
IMPRESSION: Normal

## 2014-10-28 MED ORDER — LEVETIRACETAM 500 MG PO TABS
500.0000 mg | ORAL_TABLET | Freq: Once | ORAL | Status: AC
  Administered 2014-10-28: 500 mg via ORAL
  Filled 2014-10-28: qty 1

## 2014-10-28 MED ORDER — LEVETIRACETAM 500 MG PO TABS: 500.0000 mg | ORAL_TABLET | Freq: Two times a day (BID) | ORAL | Status: DC

## 2014-10-28 NOTE — ED Provider Notes (Signed)
CSN: [id number]     Arrival date & time 10/28/14  1643 History   First MD Initiated Contact with Patient 10/28/14 1738     Chief Complaint  Patient presents with  . Seizures  . Fall     (Consider location/radiation/quality/duration/timing/severity/associated sxs/prior Treatment) HPI Comments: Patient with history of seizures, takes keppra but has not taken in over a month due to affordability issues.  Today's episode did not present with normal aura.  Patient reportedly hit head on counter top during fall related to seizure.  Patient is a 24 y.o. female presenting with seizures and fall. The history is provided by the patient. No language interpreter was used.  Seizures Seizure activity on arrival: no   Seizure type:  Unable to specify Preceding symptoms: aura   Initial focality:  Unable to specify Episode characteristics: generalized shaking   Postictal symptoms: confusion   Return to baseline: yes   Severity:  Moderate Timing:  Once Number of seizures this episode:  1 Progression:  Improving Context: medical non-compliance   Recent head injury:  During the event History of seizures: yes   Similar to previous episodes: no   Current therapy:  Levetiracetam Fall    Past Medical History  Diagnosis Date  . Seizures   . Allergy   . Depression   . Migraines   . Enlarged pituitary gland    Past Surgical History  Procedure Laterality Date  . Appendectomy     Family History  Problem Relation Age of Onset  . Adopted: Yes   History  Substance Use Topics  . Smoking status: Never Smoker   . Smokeless tobacco: Never Used  . Alcohol Use: No   OB History    Gravida Para Term Preterm AB TAB SAB Ectopic Multiple Living   0 0 0 0 0 0 0 0       Review of Systems  Neurological: Positive for seizures and syncope.  All other systems reviewed and are negative.     Allergies  Other  Home Medications   Prior to Admission medications   Medication Sig Start Date End  Date Taking? Authorizing Provider  levETIRAcetam (KEPPRA) 500 MG tablet Take 1 tablet (500 mg total) by mouth 2 (two) times daily. 10/08/14  Yes Heather Laisure, PA-C  cephALEXin (KEFLEX) 500 MG capsule Take 1 capsule (500 mg total) by mouth 2 (two) times daily. Patient not taking: Reported on 08/29/2014 06/10/14   Everlene Balls, MD  ibuprofen (ADVIL,MOTRIN) 800 MG tablet Take 1 tablet (800 mg total) by mouth 3 (three) times daily. Patient not taking: Reported on 08/29/2014 08/11/14   Jarrett Soho Muthersbaugh, PA-C  promethazine (PHENERGAN) 25 MG tablet Take 1 tablet (25 mg total) by mouth every 6 (six) hours as needed for nausea or vomiting. Patient not taking: Reported on 08/29/2014 06/10/14   Jarrett Soho Muthersbaugh, PA-C  sucralfate (CARAFATE) 1 G tablet Take 1 tablet (1 g total) by mouth 4 (four) times daily -  with meals and at bedtime. Patient not taking: Reported on 08/29/2014 06/10/14   Jarrett Soho Muthersbaugh, PA-C  topiramate (TOPAMAX) 25 MG tablet Take 1 tablet (25 mg total) by mouth 2 (two) times daily. Patient not taking: Reported on 08/29/2014 06/19/14   Robbie Lis, MD   BP 96/69 mmHg  Pulse 68  Temp(Src) 98.4 F (36.9 C) (Oral)  Resp 16  SpO2 99% Physical Exam  Constitutional: She is oriented to person, place, and time. She appears well-developed and well-nourished. No distress.  HENT:  Head: Normocephalic.  Mouth/Throat: Oropharynx is clear and moist.  Eyes: Conjunctivae and EOM are normal.  Neck: Neck supple.  Cardiovascular: Normal rate and regular rhythm.   No murmur heard. Pulmonary/Chest: Effort normal and breath sounds normal.  Abdominal: Soft. Bowel sounds are normal.  Musculoskeletal: She exhibits no edema or tenderness.  Neurological: She is alert and oriented to person, place, and time. No cranial nerve deficit. Coordination normal.  Skin: Skin is warm and dry.  Psychiatric: She has a normal mood and affect.  Nursing note and vitals reviewed.   ED Course  Procedures (including  critical care time) Labs Review Labs Reviewed  CBC WITH DIFFERENTIAL/PLATELET  BASIC METABOLIC PANEL  URINALYSIS, ROUTINE W REFLEX MICROSCOPIC    Imaging Review No results found.   EKG Interpretation   Date/Time:  Tuesday Oct 28 2014 16:51:23 EDT Ventricular Rate:  66 PR Interval:  119 QRS Duration: 96 QT Interval:  388 QTC Calculation: 406 R Axis:   67 Text Interpretation:  Sinus rhythm Borderline short PR interval Baseline  wander in lead(s) V2 V6 Confirmed by Johnney Killian, MD, Jeannie Done [phone]) on  10/28/2014 4:55:06 PM       Lab and radiology results reviewed and shared with patient.  Patient seen by case management, who provided patient with medication assistance and resources for primary care providers. MDM   Final diagnoses:  Seizure    Seizures. Non-compliant with medication.  Case manager provided [facility] prescription. Referral to [facility]. Return precautions discussed.    Etta Quill, NP 10/29/14 0258  Ernestina Patches, MD 10/29/14 1048

## 2014-10-28 NOTE — Care Management Note (Addendum)
Case Management Note  Patient Details  Name: Candace Barnes MRN: [id number] Date of Birth: [birth year]  Subjective/Objective:    Patient presents to ED post seizure.  Patient rpeorts she has not been able to afford her seizure medications.                Action/Plan:  Medication assistance, pcp resources   Expected Discharge Date:   10/28/2014               Expected Discharge Plan:     In-House Referral:     Discharge planning Services  CM Consult, [place] [facility] (pcp issues)  Post Acute Care Choice:    Choice offered to:     DME Arranged:    DME Agency:     HH Arranged:    [place] Agency:     Status of Service:  Completed, signed off  Medicare Important Message Given:    Date Medicare IM Given:    Medicare IM give by:    Date Additional Medicare IM Given:    Additional Medicare Important Message give by:     If discussed at [place] of Stay Meetings, dates discussed:    Additional Comments:  EDCM spoke to patient at bedside. Patient confirms she does not have a pcp or insurance living in [place].  [facility] provided patient with pamphlet to [facility], informed patient of services there and walk in times.  EDCM also provided patient with list of pcps who accept self pay patients, list of discount pharmacies and websites needymeds.org and GoodRX.com for medication assistance, phone number to inquire about the orange card, phone number to inquire about Mediciad, phone number to inquire about the [place], financial resources in the community such as local churches, [employer], urban ministries, and dental assistance for uninsured patients.  Patient reports he Medicaid is still lapsed, and has still not found a pcp.  EDCM strongly urged patient to find pcp so that she has someone to follow up with and may assist with her medication cost.  Discussed importance of taking her medications. [facility] provided [place] letter to patient.  Informed patient she has seven days to get her rx  filled otherwise the [facility] letter will expire.  EDCM explained this is a one time assist and she will not be able to have this assistance again until this time next year. Patient thankful for resources.  No further EDCM needs at this time.  Christen Bame, Kodie Pick, RN 10/28/2014, 8:16 PM

## 2014-10-28 NOTE — ED Notes (Addendum)
Per EMS. Pt had witnessed seizure at work which caused a fall. Pt hit head when she fell. Pt has hx of seizures, takes keppra. Last seizure was a month ago, was evaluated at that time in the ED, but has not seen neurologist since then. Pt initially complained of nausea, but nausea relieved with 4mg  zofran given by EMS. Pt states she has not taken Keppra in a month due to financial issues.

## 2014-10-28 NOTE — Discharge Instructions (Signed)

## 2014-10-28 NOTE — ED Notes (Signed)
Bed: YQ82 Expected date:  Expected time:  Means of arrival:  Comments: EMS- seizure, Hx of same

## 2014-11-09 ENCOUNTER — Emergency Department
Admission: EM | Admit: 2014-11-09 | Discharge: 2014-11-09 | Disposition: A | Discharge status: Home / Self Care | Payer: Medicaid Other | Attending: Emergency Medicine | Admitting: Emergency Medicine

## 2014-11-09 DIAGNOSIS — F419 Anxiety disorder, unspecified: Secondary | ICD-10-CM | POA: Insufficient documentation

## 2014-11-09 DIAGNOSIS — Z139 Encounter for screening, unspecified: Secondary | ICD-10-CM

## 2014-11-09 DIAGNOSIS — Z792 Long term (current) use of antibiotics: Secondary | ICD-10-CM | POA: Insufficient documentation

## 2014-11-09 DIAGNOSIS — Z79899 Other long term (current) drug therapy: Secondary | ICD-10-CM | POA: Insufficient documentation

## 2014-11-09 DIAGNOSIS — R200 Anesthesia of skin: Secondary | ICD-10-CM | POA: Insufficient documentation

## 2014-11-09 DIAGNOSIS — Z008 Encounter for other general examination: Secondary | ICD-10-CM | POA: Insufficient documentation

## 2014-11-09 MED ORDER — LEVETIRACETAM 500 MG PO TABS
ORAL_TABLET | ORAL | Status: AC
Start: 2014-11-09 — End: 2014-11-09
  Administered 2014-11-09: 500 mg via ORAL
  Filled 2014-11-09: qty 1

## 2014-11-09 MED ORDER — LEVETIRACETAM 500 MG PO TABS
500.0000 mg | ORAL_TABLET | Freq: Once | ORAL | Status: AC
  Administered 2014-11-09: 500 mg via ORAL
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
## 2014-11-09 NOTE — ED Notes (Signed)
Pt states that her "whole body is numb, shaky, feet cold, tunnel vision" X 3 days. Hx of seizures; pt takes Keppra and has not been taking for X 3 weeks. Pt alert and oriented X4, active, cooperative, pt in NAD. RR even and unlabored, color WNL.

## 2014-12-25 ENCOUNTER — Emergency Department
Admission: EM | Admit: 2014-12-25 | Discharge: 2014-12-25 | Disposition: A | Discharge status: Home / Self Care | Payer: Medicaid Other | Attending: Emergency Medicine | Admitting: Emergency Medicine

## 2014-12-25 ENCOUNTER — Encounter: Payer: Self-pay | Admitting: *Deleted

## 2014-12-25 DIAGNOSIS — R590 Localized enlarged lymph nodes: Secondary | ICD-10-CM

## 2014-12-25 DIAGNOSIS — R591 Generalized enlarged lymph nodes: Secondary | ICD-10-CM | POA: Insufficient documentation

## 2014-12-25 DIAGNOSIS — Z79899 Other long term (current) drug therapy: Secondary | ICD-10-CM | POA: Insufficient documentation

## 2014-12-25 MED ORDER — IBUPROFEN 600 MG PO TABS: 600.0000 mg | ORAL_TABLET | Freq: Three times a day (TID) | ORAL | Status: DC | PRN

## 2014-12-25 MED ORDER — SULFAMETHOXAZOLE-TRIMETHOPRIM 400-80 MG PO TABS: 1.0000 | ORAL_TABLET | Freq: Two times a day (BID) | ORAL | Status: DC

## 2014-12-25 NOTE — ED Notes (Addendum)
Pt reports that she noticed a "bump" on the right side of her head (posterior) this evening. She states that it has gotten larger and is hurting worse since she arrived at hospital. Pt alert & oriented with warm, dry skin and NAD.
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
## 2014-12-25 NOTE — Discharge Instructions (Signed)
Take medication as prescribed. Avoid frequent irritation or rubbing of area.  Follow-up with your primary care physician or the above this week for follow-up. Close follow-up is important. Continue to monitor area for increased swelling.   Return to the ER for increased swelling, pain, drainage, neck pain or new or worsening concerns.  Lymphadenopathy Lymphadenopathy means "disease of the lymph glands." But the term is usually used to describe swollen or enlarged lymph glands, also called lymph nodes. These are the bean-shaped organs found in many locations including the neck, underarm, and groin. Lymph glands are part of the immune system, which fights infections in your body. Lymphadenopathy can occur in just one area of the body, such as the neck, or it can be generalized, with lymph node enlargement in several areas. The nodes found in the neck are the most common sites of lymphadenopathy. CAUSES When your immune system responds to germs (such as viruses or bacteria ), infection-fighting cells and fluid build up. This causes the glands to grow in size. Usually, this is not something to worry about. Sometimes, the glands themselves can become infected and inflamed. This is called lymphadenitis. Enlarged lymph nodes can be caused by many diseases:  Bacterial disease, such as strep throat or a skin infection.  Viral disease, such as a common cold.  Other germs, such as Lyme disease, tuberculosis, or sexually transmitted diseases.  Cancers, such as lymphoma (cancer of the lymphatic system) or leukemia (cancer of the white blood cells).  Inflammatory diseases such as lupus or rheumatoid arthritis.  Reactions to medications. Many of the diseases above are rare, but important. This is why you should see your caregiver if you have lymphadenopathy. SYMPTOMS  Swollen, enlarged lumps in the neck, back of the head, or other locations.  Tenderness.  Warmth or redness of the skin over the lymph  nodes.  Fever. DIAGNOSIS Enlarged lymph nodes are often near the source of infection. They can help health care providers diagnose your illness. For instance:  Swollen lymph nodes around the jaw might be caused by an infection in the mouth.  Enlarged glands in the neck often signal a throat infection.  Lymph nodes that are swollen in more than one area often indicate an illness caused by a virus. Your caregiver will likely know what is causing your lymphadenopathy after listening to your history and examining you. Blood tests, x-rays, or other tests may be needed. If the cause of the enlarged lymph node cannot be found, and it does not go away by itself, then a biopsy may be needed. Your caregiver will discuss this with you. TREATMENT Treatment for your enlarged lymph nodes will depend on the cause. Many times the nodes will shrink to normal size by themselves, with no treatment. Antibiotics or other medicines may be needed for infection. Only take over-the-counter or prescription medicines for pain, discomfort, or fever as directed by your caregiver. HOME CARE INSTRUCTIONS Swollen lymph glands usually return to normal when the underlying medical condition goes away. If they persist, contact your health-care provider. He/she might prescribe antibiotics or other treatments, depending on the diagnosis. Take any medications exactly as prescribed. Keep any follow-up appointments made to check on the condition of your enlarged nodes. SEEK MEDICAL CARE IF:  Swelling lasts for more than two weeks.  You have symptoms such as weight loss, night sweats, fatigue, or fever that does not go away.  The lymph nodes are hard, seem fixed to the skin, or are growing rapidly.  Skin  over the lymph nodes is red and inflamed. This could mean there is an infection. SEEK IMMEDIATE MEDICAL CARE IF:  Fluid starts leaking from the area of the enlarged lymph node.  You develop a fever of 102 F (38.9 C) or  greater.  Severe pain develops (not necessarily at the site of a large lymph node).  You develop chest pain or shortness of breath.  You develop worsening abdominal pain. MAKE SURE YOU:  Understand these instructions.  Will watch your condition.  Will get help right away if you are not doing well or get worse. Document Released: 03/01/2008 Document Revised: 10/07/2013 Document Reviewed: 03/01/2008 [facility] Patient Information 2015 [place], [place]. This information is not intended to replace advice given to you by your health care provider. Make sure you discuss any questions you have with your health care provider.

## 2014-12-25 NOTE — ED Notes (Signed)

## 2014-12-25 NOTE — ED Notes (Signed)
Pt has swollen area to back of scalp in hairline.  Area tender to touch,  No drainage.

## 2015-03-09 ENCOUNTER — Emergency Department (HOSPITAL_COMMUNITY)
Admission: EM | Admit: 2015-03-09 | Discharge: 2015-03-10 | Disposition: A | Discharge status: Home / Self Care | Payer: Medicaid Other | Attending: Emergency Medicine | Admitting: Emergency Medicine

## 2015-03-09 ENCOUNTER — Encounter (HOSPITAL_COMMUNITY): Payer: Self-pay | Admitting: *Deleted

## 2015-03-09 DIAGNOSIS — N390 Urinary tract infection, site not specified: Secondary | ICD-10-CM | POA: Insufficient documentation

## 2015-03-09 DIAGNOSIS — N926 Irregular menstruation, unspecified: Secondary | ICD-10-CM | POA: Insufficient documentation

## 2015-03-09 DIAGNOSIS — Z3202 Encounter for pregnancy test, result negative: Secondary | ICD-10-CM | POA: Insufficient documentation

## 2015-03-09 DIAGNOSIS — Z8669 Personal history of other diseases of the nervous system and sense organs: Secondary | ICD-10-CM | POA: Insufficient documentation

## 2015-03-09 DIAGNOSIS — Z720 Tobacco use: Secondary | ICD-10-CM | POA: Insufficient documentation

## 2015-03-09 DIAGNOSIS — G40909 Epilepsy, unspecified, not intractable, without status epilepticus: Secondary | ICD-10-CM | POA: Insufficient documentation

## 2015-03-09 DIAGNOSIS — F329 Major depressive disorder, single episode, unspecified: Secondary | ICD-10-CM | POA: Insufficient documentation

## 2015-03-09 NOTE — ED Notes (Addendum)
Pt reports two night ago she started having red blood when she urinates. Pt says that she does not having any bleeding when she is not urinating, denies clotting.  Pt says she has been having lower abdomen and back cramping for several weeks.  LMP July, approx 3 months pregnant. Pt was last see in [place], [place] about 2 weeks ago and reports she had labs and ultrasounds completed at that time

## 2015-03-09 NOTE — ED Notes (Signed)
Pt states that she is pregnant and that she has been cramping to her lower abd and pt states "it feels like I have to push to pee"; pt c/o pain upon urination; pt states that "it feels like needles" when she is trying to urinate; pt states that she has been holding her urine and not going to the bathroom so it wouldn't hurt; pt also c/o nausea and not being able to eat as per her normal; pt denies bleeding or vaginal discharge

## 2015-03-09 NOTE — ED Notes (Signed)
Pt advises that she has not been receiving prenatal care

## 2015-03-10 LAB — URINALYSIS, ROUTINE W REFLEX MICROSCOPIC
Bilirubin Urine: NEGATIVE
GLUCOSE, UA: NEGATIVE mg/dL
Hgb urine dipstick: NEGATIVE
Ketones, ur: NEGATIVE mg/dL
Nitrite: NEGATIVE
PH: 6 (ref 5.0–8.0)
Protein, ur: NEGATIVE mg/dL
Specific Gravity, Urine: 1.025 (ref 1.005–1.030)
Urobilinogen, UA: 1 mg/dL (ref 0.0–1.0)

## 2015-03-10 LAB — URINE MICROSCOPIC-ADD ON

## 2015-03-10 LAB — POC URINE PREG, ED: Preg Test, Ur: NEGATIVE

## 2015-03-10 LAB — PREGNANCY, URINE: PREG TEST UR: NEGATIVE

## 2015-03-10 MED ORDER — CEPHALEXIN 500 MG PO CAPS: 500.0000 mg | ORAL_CAPSULE | Freq: Four times a day (QID) | ORAL | Status: DC

## 2015-03-10 MED ORDER — PHENAZOPYRIDINE HCL 200 MG PO TABS: 200.0000 mg | ORAL_TABLET | Freq: Three times a day (TID) | ORAL | Status: DC

## 2015-03-10 MED ORDER — CEPHALEXIN 500 MG PO CAPS
500.0000 mg | ORAL_CAPSULE | Freq: Once | ORAL | Status: AC
  Administered 2015-03-10: 500 mg via ORAL
  Filled 2015-03-10: qty 1

## 2015-03-10 MED ORDER — PHENAZOPYRIDINE HCL 200 MG PO TABS
200.0000 mg | ORAL_TABLET | Freq: Three times a day (TID) | ORAL | Status: DC
  Administered 2015-03-10: 200 mg via ORAL
  Filled 2015-03-10: qty 1

## 2015-03-10 NOTE — Discharge Instructions (Signed)

## 2015-03-10 NOTE — ED Notes (Signed)
PA at bedside.

## 2015-03-10 NOTE — ED Provider Notes (Signed)
CSN: [id number]     Arrival date & time 03/09/15  2310 History   First MD Initiated Contact with Patient 03/10/15 0011     Chief Complaint  Patient presents with  . Morning Sickness  . Urinary Tract Infection     (Consider location/radiation/quality/duration/timing/severity/associated sxs/prior Treatment) HPI Comments: Patient with confirmed positive home pregnancy test presents with dysuria, hematuria that started 2 days ago. No vaginal bleeding. No fever, lower abdominal pain, nausea or vomiting. She denies flank pain. Patient reports this is her first pregnancy.   Patient is a 24 y.o. female presenting with urinary tract infection. The history is provided by the patient. No language interpreter was used.  Urinary Tract Infection Associated symptoms: no fever, no nausea, no vaginal discharge and no vomiting     Past Medical History  Diagnosis Date  . Seizures ([place])   . Allergy   . Depression   . Migraines   . Enlarged pituitary gland [facility])    Past Surgical History  Procedure Laterality Date  . Appendectomy     Family History  Problem Relation Age of Onset  . Adopted: Yes   Social History  Substance Use Topics  . Smoking status: Current Some Day Smoker  . Smokeless tobacco: Never Used  . Alcohol Use: No   OB History    Gravida Para Term Preterm AB TAB SAB Ectopic Multiple Living   1 0 0 0 0 0 0 0       Review of Systems  Constitutional: Negative for fever and chills.  Gastrointestinal: Negative.  Negative for nausea and vomiting.  Genitourinary: Positive for dysuria and hematuria. Negative for vaginal bleeding and vaginal discharge.  Musculoskeletal: Negative.  Negative for myalgias.  Skin: Negative.   Neurological: Negative.       Allergies  Other  Home Medications   Prior to Admission medications   Medication Sig Start Date End Date Taking? Authorizing Provider  cephALEXin (KEFLEX) 500 MG capsule Take 1 capsule (500 mg total) by mouth 2 (two) times  daily. Patient not taking: Reported on 08/29/2014 06/10/14   Everlene Balls, MD  ibuprofen (ADVIL,MOTRIN) 600 MG tablet Take 1 tablet (600 mg total) by mouth every 8 (eight) hours as needed for mild pain or moderate pain. Patient not taking: Reported on 03/10/2015 12/25/14   Marylene Land, NP  levETIRAcetam (KEPPRA) 500 MG tablet Take 1 tablet (500 mg total) by mouth 2 (two) times daily. Patient not taking: Reported on 03/10/2015 10/28/14   Etta Quill, NP  promethazine (PHENERGAN) 25 MG tablet Take 1 tablet (25 mg total) by mouth every 6 (six) hours as needed for nausea or vomiting. Patient not taking: Reported on 03/10/2015 06/10/14   Jarrett Soho Muthersbaugh, PA-C  sucralfate (CARAFATE) 1 G tablet Take 1 tablet (1 g total) by mouth 4 (four) times daily -  with meals and at bedtime. Patient not taking: Reported on 08/29/2014 06/10/14   Jarrett Soho Muthersbaugh, PA-C  sulfamethoxazole-trimethoprim (BACTRIM) 400-80 MG per tablet Take 1 tablet by mouth 2 (two) times daily. Patient not taking: Reported on 03/10/2015 12/25/14   Marylene Land, NP  topiramate (TOPAMAX) 25 MG tablet Take 1 tablet (25 mg total) by mouth 2 (two) times daily. Patient not taking: Reported on 08/29/2014 06/19/14   Robbie Lis, MD   BP 115/76 mmHg  Pulse 71  Temp(Src) 98.2 F (36.8 C) (Oral)  Resp 17  SpO2 100%  LMP 12/08/2014 Physical Exam  Constitutional: She is oriented to person, place, and time. She appears well-developed  and well-nourished.  Neck: Normal range of motion.  Pulmonary/Chest: Effort normal.  Abdominal: Soft. There is no tenderness. There is no rebound and no guarding.  Neurological: She is alert and oriented to person, place, and time.  Skin: Skin is warm and dry.    ED Course  Procedures (including critical care time) Labs Review Labs Reviewed  URINALYSIS, ROUTINE W REFLEX MICROSCOPIC (NOT AT [facility]) - Abnormal; Notable for the following:    APPearance CLOUDY (*)    Leukocytes, UA SMALL (*)    All other components  within normal limits  URINE MICROSCOPIC-ADD ON - Abnormal; Notable for the following:    Squamous Epithelial / LPF FEW (*)    Bacteria, UA MANY (*)    All other components within normal limits  POC URINE PREG, ED   Results for orders placed or performed during the hospital encounter of 03/09/15  Urinalysis, Routine w reflex microscopic-may I&O cath if menses (not at [facility])  Result Value Ref Range   Color, Urine YELLOW YELLOW   APPearance CLOUDY (A) CLEAR   Specific Gravity, Urine 1.025 1.005 - 1.030   pH 6.0 5.0 - 8.0   Glucose, UA NEGATIVE NEGATIVE mg/dL   Hgb urine dipstick NEGATIVE NEGATIVE   Bilirubin Urine NEGATIVE NEGATIVE   Ketones, ur NEGATIVE NEGATIVE mg/dL   Protein, ur NEGATIVE NEGATIVE mg/dL   Urobilinogen, UA 1.0 0.0 - 1.0 mg/dL   Nitrite NEGATIVE NEGATIVE   Leukocytes, UA SMALL (A) NEGATIVE  Urine microscopic-add on  Result Value Ref Range   Squamous Epithelial / LPF FEW (A) RARE   WBC, UA 21-50 <3 WBC/hpf   Bacteria, UA MANY (A) RARE  Pregnancy, urine  Result Value Ref Range   Preg Test, Ur NEGATIVE NEGATIVE  POC urine preg, ED (not at [facility])  Result Value Ref Range   Preg Test, Ur NEGATIVE NEGATIVE    Imaging Review No results found. I have personally reviewed and evaluated these images and lab results as part of my medical decision-making.   EKG Interpretation None      MDM   Final diagnoses:  None    1. UTI  The patient had 2 pregnancy tests here which are negative. No explanation for irregular menstrual cycle. She doe shave evidence UTI requiring abx treatment. She is stable otherwise and is discharged home. Return precautions discussed - flank pain, high fever, uncontrolled vomiting.    Charlann Lange, PA-C 03/13/15 2235  Veatrice Kells, MD 03/13/15 [phone]

## 2015-05-27 ENCOUNTER — Encounter (HOSPITAL_COMMUNITY): Payer: Self-pay

## 2015-05-27 ENCOUNTER — Emergency Department (HOSPITAL_COMMUNITY)
Admission: EM | Admit: 2015-05-27 | Discharge: 2015-05-28 | Disposition: A | Discharge status: Home / Self Care | Payer: Medicaid Other | Attending: Emergency Medicine | Admitting: Emergency Medicine

## 2015-05-27 DIAGNOSIS — Z0471 Encounter for examination and observation following alleged adult physical abuse: Secondary | ICD-10-CM | POA: Insufficient documentation

## 2015-05-27 DIAGNOSIS — Y9289 Other specified places as the place of occurrence of the external cause: Secondary | ICD-10-CM | POA: Insufficient documentation

## 2015-05-27 DIAGNOSIS — Y998 Other external cause status: Secondary | ICD-10-CM | POA: Insufficient documentation

## 2015-05-27 DIAGNOSIS — F17200 Nicotine dependence, unspecified, uncomplicated: Secondary | ICD-10-CM | POA: Insufficient documentation

## 2015-05-27 DIAGNOSIS — R569 Unspecified convulsions: Secondary | ICD-10-CM | POA: Insufficient documentation

## 2015-05-27 DIAGNOSIS — Z8639 Personal history of other endocrine, nutritional and metabolic disease: Secondary | ICD-10-CM | POA: Insufficient documentation

## 2015-05-27 DIAGNOSIS — Z8679 Personal history of other diseases of the circulatory system: Secondary | ICD-10-CM | POA: Insufficient documentation

## 2015-05-27 DIAGNOSIS — S199XXA Unspecified injury of neck, initial encounter: Secondary | ICD-10-CM | POA: Insufficient documentation

## 2015-05-27 DIAGNOSIS — Z8659 Personal history of other mental and behavioral disorders: Secondary | ICD-10-CM | POA: Insufficient documentation

## 2015-05-27 DIAGNOSIS — Y9389 Activity, other specified: Secondary | ICD-10-CM | POA: Insufficient documentation

## 2015-05-27 LAB — URINALYSIS, ROUTINE W REFLEX MICROSCOPIC
BILIRUBIN URINE: NEGATIVE
GLUCOSE, UA: NEGATIVE mg/dL
HGB URINE DIPSTICK: NEGATIVE
Ketones, ur: NEGATIVE mg/dL
Nitrite: POSITIVE — AB
PROTEIN: 30 mg/dL — AB
Specific Gravity, Urine: 1.026 (ref 1.005–1.030)
pH: 6.5 (ref 5.0–8.0)

## 2015-05-27 LAB — CBC WITH DIFFERENTIAL/PLATELET
BASOS ABS: 0 10*3/uL (ref 0.0–0.1)
BASOS PCT: 0 %
EOS ABS: 0.1 10*3/uL (ref 0.0–0.7)
Eosinophils Relative: 1 %
HCT: 36.3 % (ref 36.0–46.0)
HEMOGLOBIN: 11.5 g/dL — AB (ref 12.0–15.0)
LYMPHS ABS: 3 10*3/uL (ref 0.7–4.0)
Lymphocytes Relative: 53 %
MCH: 21.1 pg — AB (ref 26.0–34.0)
MCHC: 31.7 g/dL (ref 30.0–36.0)
MCV: 66.7 fL — ABNORMAL LOW (ref 78.0–100.0)
MONO ABS: 0.4 10*3/uL (ref 0.1–1.0)
Monocytes Relative: 6 %
NEUTROS ABS: 2.4 10*3/uL (ref 1.7–7.7)
Neutrophils Relative %: 40 %
PLATELETS: 243 10*3/uL (ref 150–400)
RBC: 5.44 MIL/uL — ABNORMAL HIGH (ref 3.87–5.11)
RDW: 18.2 % — AB (ref 11.5–15.5)
WBC: 5.9 10*3/uL (ref 4.0–10.5)

## 2015-05-27 LAB — BASIC METABOLIC PANEL
Anion gap: 8 (ref 5–15)
BUN: 17 mg/dL (ref 6–20)
CALCIUM: 9.4 mg/dL (ref 8.9–10.3)
CO2: 24 mmol/L (ref 22–32)
CREATININE: 0.94 mg/dL (ref 0.44–1.00)
Chloride: 109 mmol/L (ref 101–111)
GFR calc Af Amer: >60 mL/min (ref >60)
GLUCOSE: 109 mg/dL — AB (ref 65–99)
Potassium: 3.6 mmol/L (ref 3.5–5.1)
Sodium: 141 mmol/L (ref 135–145)

## 2015-05-27 LAB — URINE MICROSCOPIC-ADD ON: RBC / HPF: NONE SEEN RBC/hpf (ref 0–5)

## 2015-05-27 LAB — POC URINE PREG, ED: PREG TEST UR: NEGATIVE
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
## 2015-05-27 NOTE — ED Notes (Signed)
Pt states she had a seizure tonight, last seizure 6 months ago, not taking her medicine, keppra, too expensive
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
## 2015-05-28 MED ORDER — LEVETIRACETAM 500 MG PO TABS: 500.0000 mg | ORAL_TABLET | Freq: Two times a day (BID) | ORAL | Status: DC

## 2015-05-28 MED ORDER — NAPROXEN 500 MG PO TABS: 500.0000 mg | ORAL_TABLET | Freq: Two times a day (BID) | ORAL | Status: DC

## 2015-05-28 NOTE — Discharge Instructions (Signed)

## 2015-07-15 ENCOUNTER — Emergency Department (HOSPITAL_COMMUNITY)
Admission: EM | Admit: 2015-07-15 | Discharge: 2015-07-15 | Disposition: A | Discharge status: Home / Self Care | Payer: Medicaid Other | Attending: Emergency Medicine | Admitting: Emergency Medicine

## 2015-07-15 ENCOUNTER — Encounter (HOSPITAL_COMMUNITY): Payer: Self-pay

## 2015-07-15 DIAGNOSIS — Z8679 Personal history of other diseases of the circulatory system: Secondary | ICD-10-CM | POA: Insufficient documentation

## 2015-07-15 DIAGNOSIS — F17200 Nicotine dependence, unspecified, uncomplicated: Secondary | ICD-10-CM | POA: Insufficient documentation

## 2015-07-15 DIAGNOSIS — Z8659 Personal history of other mental and behavioral disorders: Secondary | ICD-10-CM | POA: Insufficient documentation

## 2015-07-15 DIAGNOSIS — R569 Unspecified convulsions: Secondary | ICD-10-CM | POA: Insufficient documentation

## 2015-07-15 DIAGNOSIS — Z8639 Personal history of other endocrine, nutritional and metabolic disease: Secondary | ICD-10-CM | POA: Insufficient documentation

## 2015-07-15 MED ORDER — LEVETIRACETAM 750 MG PO TABS
1500.0000 mg | ORAL_TABLET | Freq: Once | ORAL | Status: AC
  Administered 2015-07-15: 1500 mg via ORAL
  Filled 2015-07-15: qty 2

## 2015-07-15 MED ORDER — LEVETIRACETAM 500 MG PO TABS: 500.0000 mg | ORAL_TABLET | Freq: Two times a day (BID) | ORAL | Status: DC

## 2015-07-15 NOTE — Progress Notes (Addendum)
25 yr old pt last seen by [facility] ED CM in January 2016 for seizures, also stating her medicaid relapsed  Pt confirms with ED CM she has not been on seizure medication for 1-1 1/2 yrs and did not follow up with [facility] ED CM recommendations in 2016 nor has she attempt to get medicaid  CM spoke with pt who confirms uninsured [employer] resident with no pcp.  CM discussed and provided written information for uninsured accepting pcps, discussed the importance of pcp vs EDP services for f/u care, www.needymeds.org, www.goodrx.com, discounted pharmacies and other [employer] such as [employer], affordable care act, financial assistance, uninsured dental services, [place] med assist, DSS and  health department  Reviewed resources for [employer] uninsured accepting pcps like [facility]ossom, family medicine at [employer], [facility], [facility], local urgent care centers, [facility], [place] [facility], general medical clinics, family services of the [place], [facility] urgent care plus others, medication resources, [facility] out patient [facility]ies and housing Pt voiced understanding and appreciation of resources provided   Provided [facility] contact information Pt agreed to a referral Cm completed referral Pt to be contact by [facility] clinical liason CM encouraged use of goodrx, [place] med assist, DSS, [facility] resources  ED CM consulted by EDP, [facility] for medication assistance   CM reviewed EPIC notes and chart review information CM spoke with the pt about [facility] MATCH program Pt could not recall [place] assistance letter being provided but when CM reviewed PDMI, CM found pt had been provided [facility] assistance May 2016 Pt is NOT eligible for [facility] MATCH program (able to find pt listed in PDMI per cardholder name inquiry) CM discussed goodrx cost and provided pt with a copy of cost sheet for keppra 500 mg 60 tabs as $2547 at [place] and $10 at [employer] with a membership  Pt able to complete teach  back method with ED CM about need to complete medicaid application, [id number] process and use of goodrx, new pcp and [place] med assist program in future

## 2015-07-15 NOTE — ED Notes (Signed)
MD at bedside. 

## 2015-07-15 NOTE — Discharge Instructions (Signed)
Reapply for Medicaid. Follow-up with a neurologist. Taking your seizure medications as prescribed. Seizure, Adult A seizure is abnormal electrical activity in the brain. Seizures usually last from 30 seconds to 2 minutes. There are various types of seizures. Before a seizure, you may have a warning sensation (aura) that a seizure is about to occur. An aura may include the following symptoms:   Fear or anxiety.  Nausea.  Feeling like the room is spinning (vertigo).  Vision changes, such as seeing flashing lights or spots. Common symptoms during a seizure include:  A change in attention or behavior (altered mental status).  Convulsions with rhythmic jerking movements.  Drooling.  Rapid eye movements.  Grunting.  Loss of bladder and bowel control.  Bitter taste in the mouth.  Tongue biting. After a seizure, you may feel confused and sleepy. You may also have an injury resulting from convulsions during the seizure. HOME CARE INSTRUCTIONS   If you are given medicines, take them exactly as prescribed by your health care provider.  Keep all follow-up appointments as directed by your health care provider.  Do not swim or drive or engage in risky activity during which a seizure could cause further injury to you or others until your health care provider says it is OK.  Get adequate rest.  Teach friends and family what to do if you have a seizure. They should:  Lay you on the ground to prevent a fall.  Put a cushion under your head.  Loosen any tight clothing around your neck.  Turn you on your side. If vomiting occurs, this helps keep your airway clear.  Stay with you until you recover.  Know whether or not you need emergency care. SEEK IMMEDIATE MEDICAL CARE IF:  The seizure lasts longer than 5 minutes.  The seizure is severe or you do not wake up immediately after the seizure.  You have an altered mental status after the seizure.  You are having more frequent or  worsening seizures. Someone should drive you to the emergency department or call local emergency services (911 in [place] MAKE SURE YOU:  Understand these instructions.  Will watch your condition.  Will get help right away if you are not doing well or get worse.   This information is not intended to replace advice given to you by your health care provider. Make sure you discuss any questions you have with your health care provider.   Document Released: 05/20/2000 Document Revised: 06/13/2014 Document Reviewed: 01/02/2013 Elsevier Interactive Patient Education [employer].

## 2015-07-15 NOTE — Progress Notes (Signed)
CSW received call from EDP and staffed case. CSW spoke with patient at bedside. Patient reports she cannot afford her Keppra and she reports she has seizures. Patient reports she had Medicaid, however, her Medicaid has lapsed. Patient reports she has to go back to DSS and complete her recertification. CSW encouraged patient to go to DSS to complete her recertification. CSW also informed patient CSW would speak with Nurse CM to speak with her regarding assistance with medication. No questions noted for CSW at the time.  Genice Rouge [id number] ED CSW 07/15/2015 1:12 PM

## 2015-07-15 NOTE — ED Notes (Signed)
CASE MANAGER KIM SPOKE WITH PT

## 2015-07-15 NOTE — ED Notes (Signed)
Per [facility]- Witnessed full body seizures (8) lasting 8-10 minutes. No injury. No incontinent. Upon EMS arrival. Pt alert and active with care.  Pt is unable to afford medications. Last seizure in December

## 2015-07-15 NOTE — ED Notes (Signed)
No seizure activity during ED visit

## 2015-07-15 NOTE — ED Notes (Signed)
SOCIAL WORK HAS SPOKEN TO PT

## 2015-07-15 NOTE — ED Provider Notes (Signed)
CSN: [id number]     Arrival date & time 07/15/15  0903 History   First MD Initiated Contact with Patient 07/15/15 [phone]     Chief Complaint  Patient presents with  . Seizures     (Consider location/radiation/quality/duration/timing/severity/associated sxs/prior Treatment) Patient is a 25 y.o. female presenting with seizures. The history is provided by the patient and a friend.  Seizures Seizure activity on arrival: no   Seizure type:  Grand mal Preceding symptoms: aura   Initial focality:  None Episode characteristics: abnormal movements and unresponsiveness   Postictal symptoms: confusion   Return to baseline: yes   Severity:  Moderate Duration:  10 minutes Timing:  Clustered Number of seizures this episode:  5 Progression:  Resolved Recent head injury:  No recent head injuries PTA treatment:  None History of seizures: yes    25 yo F with a chief complaint of seizure. Patient has a history of seizures and is noncompliant with her medication. Patient with her Medicaid lapse and that happened about a year ago and she has not taken the time to get that fixed. Patient said she had a bunch seizures could've back-to-back today that lasted about 10 minutes in total. She is currently back to her baseline. Was previously on Keppra. Denies headaches head injuries fevers chills neck pain neck stiffness.  Past Medical History  Diagnosis Date  . Seizures ([place])   . Allergy   . Depression   . Migraines   . 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 Appendectomy     Family History  Problem Relation Age of Onset  . Adopted: Yes   Social History  Substance Use Topics  . Smoking status: Current Some Day Smoker  . Smokeless tobacco: Never Used  . Alcohol Use: No   OB History    Gravida Para Term Preterm AB TAB SAB Ectopic Multiple Living   1 0 0 0 0 0 0 0       Review of Systems  Constitutional: Negative for fever and chills.  HENT:  Negative for congestion and rhinorrhea.   Eyes: Negative for redness and visual disturbance.  Respiratory: Negative for shortness of breath and wheezing.   Cardiovascular: Negative for chest pain and palpitations.  Gastrointestinal: Negative for nausea and vomiting.  Genitourinary: Negative for dysuria and urgency.  Musculoskeletal: Negative for myalgias and arthralgias.  Skin: Negative for pallor and wound.  Neurological: Positive for seizures. Negative for dizziness and headaches.      Allergies  Other  Home Medications   Prior to Admission medications   Medication Sig Start Date End Date Taking? Authorizing Provider  levETIRAcetam (KEPPRA) 500 MG tablet Take 1 tablet (500 mg total) by mouth 2 (two) times daily. 07/15/15   Deno Etienne, DO   BP 111/70 mmHg  Pulse 70  Temp(Src) 99.3 F (37.4 C) (Oral)  Resp 21  SpO2 100%  LMP 07/08/2015 Physical Exam  Constitutional: She is oriented to person, place, and time. She appears well-developed and well-nourished. No distress.  HENT:  Head: Normocephalic and atraumatic.  Eyes: EOM are normal. Pupils are equal, round, and reactive to light.  Neck: Normal range of motion. Neck supple.  Cardiovascular: Normal rate and regular rhythm.  Exam reveals no gallop and no friction rub.   No murmur heard. Pulmonary/Chest: Effort normal. She has no wheezes. She has no rales.  Abdominal: Soft. She exhibits no distension. There is no tenderness. There is no rebound and  no guarding.  Musculoskeletal: She exhibits no edema or tenderness.  Neurological: She is alert and oriented to person, place, and time. She has normal strength. No cranial nerve deficit or sensory deficit. Coordination and gait normal. GCS eye subscore is 4. GCS verbal subscore is 5. GCS motor subscore is 6. She displays no Babinski's sign on the right side. She displays no Babinski's sign on the left side.  Reflex Scores:      Tricep reflexes are 2+ on the right side and 2+ on the  left side.      Bicep reflexes are 2+ on the right side and 2+ on the left side.      Brachioradialis reflexes are 2+ on the right side and 2+ on the left side.      Patellar reflexes are 2+ on the right side and 2+ on the left side.      Achilles reflexes are 2+ on the right side and 2+ on the left side. Skin: Skin is warm and dry. She is not diaphoretic.  Psychiatric: She has a normal mood and affect. Her behavior is normal.  Nursing note and vitals reviewed.   ED Course  Procedures (including critical care time) Labs Review Labs Reviewed - No data to display  Imaging Review No results found. I have personally reviewed and evaluated these images and lab results as part of my medical decision-making.   EKG Interpretation None      MDM   Final diagnoses:  Seizure [facility])    25 yo F with a chief complaint of a seizure. He is now currently at her baseline. Case was discussed with social work who will attempt to get her a one-month prescription for Keppra paid for. She was counseled that she needs to get her Medicaid. Loaded orally with keppra here.   3:33 PM:  I have discussed the diagnosis/risks/treatment options with the patient and family and believe the pt to be eligible for discharge home to follow-up with Neurology. We also discussed returning to the ED immediately if new or worsening sx occur. We discussed the sx which are most concerning (e.g., recurrent seizure) that necessitate immediate return. Medications administered to the patient during their visit and any new prescriptions provided to the patient are listed below.  Medications given during this visit Medications  levETIRAcetam (KEPPRA) tablet 1,500 mg (1,500 mg Oral Given 07/15/15 1016)    Discharge Medication List as of 07/15/2015 10:51 AM      The patient appears reasonably screen and/or stabilized for discharge and I doubt any other medical condition or other [facility] requiring further screening, evaluation, or  treatment in the ED at this time prior to discharge.      Deno Etienne, DO 07/15/15 1533

## 2015-09-14 ENCOUNTER — Encounter (HOSPITAL_COMMUNITY): Payer: Self-pay | Admitting: Emergency Medicine

## 2015-09-14 ENCOUNTER — Emergency Department (HOSPITAL_COMMUNITY)
Admission: EM | Admit: 2015-09-14 | Discharge: 2015-09-14 | Disposition: A | Discharge status: Home / Self Care | Payer: Medicaid Other | Attending: Emergency Medicine | Admitting: Emergency Medicine

## 2015-09-14 DIAGNOSIS — F17200 Nicotine dependence, unspecified, uncomplicated: Secondary | ICD-10-CM | POA: Insufficient documentation

## 2015-09-14 DIAGNOSIS — R05 Cough: Secondary | ICD-10-CM | POA: Insufficient documentation

## 2015-09-14 DIAGNOSIS — R569 Unspecified convulsions: Secondary | ICD-10-CM | POA: Insufficient documentation

## 2015-09-14 NOTE — ED Notes (Signed)
Pt states that she ran out of her seizure meds and has been having frequent seizures x 1 week. Also states she started having a cough yesterday. Alert and oriented.

## 2015-09-14 NOTE — ED Provider Notes (Signed)
Patient decided she would come back tomorrow before I had the chance to evaluate her. Left without my knowledge after triage but before being seen.  Delos Haring, PA-C 09/14/15 [id number]  Merryl Hacker, MD 09/14/15 [phone]

## 2015-10-09 ENCOUNTER — Encounter (HOSPITAL_COMMUNITY): Payer: Self-pay | Admitting: Emergency Medicine

## 2015-10-09 ENCOUNTER — Emergency Department (HOSPITAL_COMMUNITY)
Admission: EM | Admit: 2015-10-09 | Discharge: 2015-10-09 | Disposition: A | Discharge status: Home / Self Care | Payer: Medicaid Other | Attending: Emergency Medicine | Admitting: Emergency Medicine

## 2015-10-09 DIAGNOSIS — G40909 Epilepsy, unspecified, not intractable, without status epilepticus: Secondary | ICD-10-CM | POA: Insufficient documentation

## 2015-10-09 DIAGNOSIS — F439 Reaction to severe stress, unspecified: Secondary | ICD-10-CM | POA: Insufficient documentation

## 2015-10-09 DIAGNOSIS — F430 Acute stress reaction: Secondary | ICD-10-CM

## 2015-10-09 DIAGNOSIS — Z79899 Other long term (current) drug therapy: Secondary | ICD-10-CM | POA: Insufficient documentation

## 2015-10-09 DIAGNOSIS — F17210 Nicotine dependence, cigarettes, uncomplicated: Secondary | ICD-10-CM | POA: Insufficient documentation

## 2015-10-09 DIAGNOSIS — F445 Conversion disorder with seizures or convulsions: Secondary | ICD-10-CM

## 2015-10-09 DIAGNOSIS — F329 Major depressive disorder, single episode, unspecified: Secondary | ICD-10-CM | POA: Insufficient documentation

## 2015-10-09 LAB — CBG MONITORING, ED: GLUCOSE-CAPILLARY: 81 mg/dL (ref 65–99)

## 2015-10-09 MED ORDER — LEVETIRACETAM 500 MG PO TABS: 500.0000 mg | ORAL_TABLET | Freq: Two times a day (BID) | ORAL | Status: DC

## 2015-10-09 MED ORDER — LEVETIRACETAM 500 MG PO TABS
1000.0000 mg | ORAL_TABLET | Freq: Once | ORAL | Status: AC
  Administered 2015-10-09: 1000 mg via ORAL
  Filled 2015-10-09: qty 2

## 2015-10-09 NOTE — ED Provider Notes (Signed)
CSN: [id number]     Arrival date & time 10/09/15  1021 History   First MD Initiated Contact with Patient 10/09/15 1035     Chief Complaint  Patient presents with  . Seizures     (Consider location/radiation/quality/duration/timing/severity/associated sxs/prior Treatment) Patient is a 25 y.o. female presenting with seizures. The history is provided by the patient.  Seizures Seizure activity on arrival: no   Seizure type:  Grand mal Episode characteristics: partial responsiveness (was responsive, frozen in place) and stiffening   Episode characteristics: no incontinence   Episode characteristics comment:  Was holding a cigarette in the air while not responding Postictal symptoms: confusion   Return to baseline: yes   Severity:  Moderate Timing:  Once Context: emotional upset, medical non-compliance and stress   Context: not previous head injury   PTA treatment:  None History of seizures: yes (With similar characteristics)     Past Medical History  Diagnosis Date  . Seizures ([place])   . Allergy   . Depression   . Migraines   . 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 Appendectomy     Family History  Problem Relation Age of Onset  . Adopted: Yes   Social History  Substance Use Topics  . Smoking status: Current Some Day Smoker -- 0.25 packs/day  . Smokeless tobacco: Never Used  . Alcohol Use: No   OB History    Gravida Para Term Preterm AB TAB SAB Ectopic Multiple Living   1 0 0 0 0 0 0 0       Review of Systems  Neurological: Positive for seizures.  All other systems reviewed and are negative.     Allergies  Other  Home Medications   Prior to Admission medications   Medication Sig Start Date End Date Taking? Authorizing Provider  levETIRAcetam (KEPPRA) 500 MG tablet Take 1 tablet (500 mg total) by mouth 2 (two) times daily. 07/15/15   Deno Etienne, DO   BP 108/74 mmHg  Pulse 66  Temp(Src) 98.6 F (37 C) (Oral)   Resp 18  Ht 6\' 1"  (1.854 m)  Wt 140 lb (63.504 kg)  BMI 18.47 kg/m2  SpO2 100%  LMP 09/09/2015 Physical Exam  Constitutional: She is oriented to person, place, and time. She appears well-developed and well-nourished. No distress.  HENT:  Head: Normocephalic.  Eyes: Conjunctivae are normal.  Neck: Neck supple. No tracheal deviation present.  Cardiovascular: Normal rate and regular rhythm.   Pulmonary/Chest: Effort normal. No respiratory distress.  Abdominal: Soft. She exhibits no distension.  Neurological: She is alert and oriented to person, place, and time. She has normal strength. She displays no tremor. No cranial nerve deficit. Coordination and gait normal. GCS eye subscore is 4. GCS verbal subscore is 5. GCS motor subscore is 6.  Skin: Skin is warm and dry.  Psychiatric: She has a normal mood and affect.    ED Course  Procedures (including critical care time) Labs Review Labs Reviewed  CBG MONITORING, ED    Imaging Review No results found. I have personally reviewed and evaluated these images and lab results as part of my medical decision-making.   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 She was smoking a cigarette bed with her boyfriend and rolled on her side while  holding a cigarette in the air shaking uncontrollably. The patient's boyfriend was able to remove a cigarette from her hands safely and then she lay on the floor, trying to roll around. She did not have incontinence, she was responsive to voice commands but was unable to speak and was staring blankly. This happened after an increased level of stress and arguing. She has been seizure-free recently and had not taken her Keppra in over a year. She had previous stay in the hospital with workup including normal EEG and normal neuro imaging. 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 At this time I feel it is reasonable to restart her Keppra pending an outpatient neurologic evaluation and establishment of primary care physician care. I also recommended that she seek counseling and other outlets for stress to prevent recurrent symptoms. No further episodes in the emergency room.     Leo Grosser, MD 10/09/15 [phone]

## 2015-10-09 NOTE — ED Notes (Signed)
1 IV insertion in hand - unsuccessful. Patient refusing another IV stick. I explained the importance of IV insertion and patient is refusing an IV at this time.

## 2015-10-09 NOTE — ED Notes (Signed)
Per EMS, patient has had a few seizures this morning. Patient's boyfriend counted 7 in a 10 minute period. Upon EMS, arrival patient was post-ictal. Patient is now conscious, alert, and oriented. Hx of seizure; patient is non-compliant with taking Keppra. Prior to today, patient's last seizure was in the beginning of April.

## 2015-10-09 NOTE — Discharge Instructions (Signed)
Nonepileptic Seizures Nonepileptic seizures are seizures that are not caused by abnormal electrical signals in your brain. These seizures often seem like epileptic seizures, but they are not caused by epilepsy.  There are two types of nonepileptic seizures:  A physiologic nonepileptic seizure results from a disruption in your brain.  A psychogenic seizure results from emotional stress. These seizures are sometimes called pseudoseizures. CAUSES  Causes of physiologic nonepileptic seizures include:   Sudden drop in blood pressure.  Low blood sugar.  Low levels of salt (sodium) in your blood.  Low levels of calcium in your blood.  Migraine.  Heart rhythm problems.  Sleep disorders.  Drug and alcohol abuse. Common causes of psychogenic nonepileptic seizures include:  Stress.  Emotional trauma.  Sexual or physical abuse.  Major life events, such as divorce or the death of a loved one.  Mental health disorders, including panic attack and hyperactivity disorder. SIGNS AND SYMPTOMS A nonepileptic seizure can look like an epileptic seizure, including uncontrollable shaking (convulsions), or changes in attention, behavior, or the ability to remain awake and alert. However, there are some differences. Nonepileptic seizures usually:  Do not cause physical injuries.  Start slowly.  Include crying or shrieking.  Last longer than 2 minutes.  Have a short recovery time without headache or exhaustion. DIAGNOSIS  Your health care provider can usually diagnose nonepileptic seizures after taking your medical history and giving you a physical exam. Your health care provider may want to talk to your friends or relatives who have seen you have a seizure.  You may also need to have tests to look for causes of physiologic nonepileptic seizures. This may include an electroencephalogram (EEG), which is a test that measures electrical activity in your brain. If you have had an epileptic  seizure, the results of your EEG will be abnormal. If your health care provider thinks you have had a psychogenic nonepileptic seizure, you may need to see a mental health specialist for an evaluation. TREATMENT  Treatment depends on the type and cause of your seizures.  For physiologic nonepileptic seizures, treatment is aimed at addressing the underlying condition that caused the seizures. These seizures usually stop when the underlying condition is properly treated.  Nonepileptic seizures do not respond to the seizure medicines used to treat epilepsy.  For psychogenic seizures, you may need to work with a mental health specialist. [place] care will depend on the type of nonepileptic seizures you have.   Follow all your health care provider's instructions.  Keep all your follow-up appointments. SEEK MEDICAL CARE IF: You continue to have seizures after treatment. SEEK IMMEDIATE MEDICAL CARE IF:  Your seizures change or become more frequent.  You injure yourself during a seizure.  You have one seizure after another.  You have trouble recovering from a seizure.  You have chest pain or trouble breathing. MAKE SURE YOU:  Understand these instructions.  Will watch your condition.  Will get help right away if you are not doing well or get worse.   This information is not intended to replace advice given to you by your health care provider. Make sure you discuss any questions you have with your health care provider.   Document Released: 07/08/2005 Document Revised: 06/13/2014 Document Reviewed: 03/19/2013 Elsevier Interactive Patient Education 2016 [employer].  Conversion Disorder Conversion disorder is also known as functional neurological symptom disorder. People with this mental disorder have symptoms that suggest a brain or nervous system condition, such as a stroke or  seizure. However, no such condition can be found to explain the symptoms. For people  with conversion disorder, the symptoms may result in:  Severe distress or anxiety.  Disruption of relationships or other normal life activities.  Medical evaluation. This often occurs in the primary care or emergency room setting. Conversion disorder may begin anytime from late childhood through the young adult years. This disorder is more common in females than males.  CAUSES The exact cause of this disorder is unknown. It is often triggered by stressful life events involving personal relationships. SIGNS AND SYMPTOMS Signs and symptoms of conversion disorder may include:  Muscle weakness or paralysis. If this involves the bladder muscle, it can cause difficulty urinating.  Seizures or attacks of being unresponsive.  Abnormal movement, such as tremors, jerks, muscle spasms, loss of balance, or difficulty walking.  Difficulty swallowing or a feeling of having a lump in the throat.  Loss of speech (aphonia) or slurring of words (dysarthria).  Loss of the sensation of touch or pain.  Changes in vision, such as blindness or double vision.  Seeing things that are not there (hallucinations).  Changes in hearing, such as deafness. Symptoms usually occur suddenly but may increase gradually over time. They usually go away completely in a short time. Seizures and tremors are more likely than other conversion symptoms to come back or continue. DIAGNOSIS Diagnosis of this disorder starts with an evaluation by your health care provider. Exams and tests will be done to rule out serious physical health problems. The evaluation will vary depending on your specific symptoms. It may include:  A physical exam, including a neurological exam.  Lab tests on blood or urine samples.  X-rays or other imaging studies.  An electroencephalogram (EEG) to monitor brain waves for seizure activity. Your health care provider may refer you to a mental health specialist for psychological  evaluation. TREATMENT The main goal of treatment is to get the symptoms to go away as quickly as possible. Most people with this disorder respond well to relaxation techniques and reassurance from their health care provider that the symptoms are stress related. For people with symptoms that do not go away, the following treatment options are available:  Counseling or talk therapy. Talk therapy is provided by mental health specialists. It involves discussing stressful life events that may have triggered the symptoms and ways to cope with these issues.  Hypnotherapy. This is the use of hypnosis to help a person overcome conversion symptoms. It is provided by mental health specialists.  Amobarbital interview. This involves discussing stressful life events that may have triggered the symptoms. The mental health specialist asks you questions after you take a short-acting medicine (amobarbital) that produces a hypnotic state.  Medicine. Antidepressant medicine may be helpful even if a person with conversion symptoms is not depressed.  Physical therapy. Physical therapy can help maintain muscle strength in cases of long-term paralysis. HOME CARE INSTRUCTIONS  Take medicines only as directed by your health care provider.  Try to reduce stress.  Keep all follow-up visits as directed by your health care provider. This is important. SEEK MEDICAL CARE IF:  Your symptoms do not go away or they become severe.  You develop new symptoms. SEEK IMMEDIATE MEDICAL CARE IF: You have serious thoughts about hurting yourself or someone else.   This information is not intended to replace advice given to you by your health care provider. Make sure you discuss any questions you have with your health care provider.  Document Released: 06/25/2010 Document Revised: 06/13/2014 Document Reviewed: 10/03/2013 Elsevier Interactive Patient Education 2016 [place].  Outpatient Psychiatry and  Counseling  [facility]: Mobile Crisis Management 24 hours:  [phone]  [facility] [employer] sliding scale fee and walk in schedule: M-F 8am-12pm/1pm-3pm [place], [place] [address] [place], [place] [address] [phone]  [facility] ([facility] [employer])- new patient walk-in appointments available Monday - Friday 8am -3pm.          [address] [place], [place] [address] [phone] or crisis line- [facility]/ Intensive Outpatient Therapy Program [place], [place] [address] [place]      [phone] [address], [place] [address]                 [place]   [facility] [phone]. [place], [place] [address]   [employer] [facility]          [address] Johnette Abraham  [place], [place] [address]       [phone]  [facility] [address] [place], [place] [address] [phone]  [facility]    [address] [place], [place] [address]     [place], [place]     [place] [address]     [phone]       [facility] [place] [address]  [facility]     [address], [place], [place] [address] [place], [place] [address] [place]     [address]     [place], [place] [address]     [phone]       [facility] [address] [place], [place] [address] [phone] Resources for Temporary Residential Assistance/Crisis  Centers

## 2015-10-09 NOTE — ED Notes (Signed)
Bed: WA15 Expected date:  Expected time:  Means of arrival:  Comments: EMS-SZ 

## 2015-12-31 ENCOUNTER — Emergency Department (HOSPITAL_COMMUNITY)
Admission: EM | Admit: 2015-12-31 | Discharge: 2015-12-31 | Discharge status: Against Medical Advice | Payer: Medicaid Other

## 2015-12-31 NOTE — ED Notes (Signed)
Unable to locate pt. several times by staff.  

## 2015-12-31 NOTE — ED Notes (Signed)
Pt has been called multiple times with no answer.

## 2015-12-31 NOTE — ED Notes (Signed)
Patient talked to this nurse and stated she was going to leave and if she got worse would return tomorrow

## 2015-12-31 NOTE — ED Notes (Signed)
Patient registered then walked out to the car to talk to her ride.

## 2016-01-26 ENCOUNTER — Encounter (HOSPITAL_COMMUNITY): Payer: Self-pay | Admitting: Emergency Medicine

## 2016-01-26 ENCOUNTER — Emergency Department (HOSPITAL_COMMUNITY)
Admission: EM | Admit: 2016-01-26 | Discharge: 2016-01-26 | Disposition: A | Discharge status: Home / Self Care | Payer: Medicaid Other | Attending: Emergency Medicine | Admitting: Emergency Medicine

## 2016-01-26 DIAGNOSIS — G40909 Epilepsy, unspecified, not intractable, without status epilepticus: Secondary | ICD-10-CM | POA: Insufficient documentation

## 2016-01-26 DIAGNOSIS — F17200 Nicotine dependence, unspecified, uncomplicated: Secondary | ICD-10-CM | POA: Insufficient documentation

## 2016-01-26 DIAGNOSIS — R569 Unspecified convulsions: Secondary | ICD-10-CM

## 2016-01-26 MED ORDER — LEVETIRACETAM 750 MG PO TABS
1500.0000 mg | ORAL_TABLET | Freq: Once | ORAL | Status: AC
  Administered 2016-01-26: 1500 mg via ORAL
  Filled 2016-01-26: qty 2

## 2016-01-26 MED ORDER — LEVETIRACETAM 500 MG PO TABS: 500.0000 mg | ORAL_TABLET | Freq: Two times a day (BID) | ORAL | 0 refills | Status: DC

## 2016-01-26 NOTE — Progress Notes (Addendum)
Pt states she has medicaid to cover her OB GYN Appointments only Discussed with pt that this is medicaid family planning and [facility] does not have a medication program to assist medicaid patients with medications but Cm would be able to provide medication program resources to assist her  Pt states she take 1500 (2 tablets) mg Keppra twice a day States she was placed on the dose by an ED provider not a pcp  EPIC indicates pt [facility]  CM spoke with pt about uninsured resources for pcps vs EDPs, www.needymeds.org, www.goodrx.com, discounted pharmacies and other [employer] such as [employer], affordable care act, financial assistance, uninsured dental services, [place] med assist, DSS and  health department, [place] uninsured accepting pcps like Jinny Blossom, family medicine at [address], [facility], [facility], local urgent care centers, [facility], [facility] family practice, general medical clinics, family services of the [place], [facility] urgent care plus others, medication resources, [facility] out patient pharmacies and housing Pt voiced understanding and appreciation of resources provided   Provided [facility] contact information Pt informed that she is not eligible for P4CC related to having medicaid family planning   1508 entered in d/c instructions Please use the resources provided to you in emergency room by case manager to assist you're your choice of doctor for follow up     These [employer] uninsured resources provide possible primary care providers, resources for discounted medications, housing, dental resources, affordable care act information, plus other resources for [employer]    Next Steps: Schedule an appointment as soon as possible for a visit today  Instructions: Please 1) use discount goodrx cards to get meds 2) get pcp to help with discount pharmacy or evaluate you to see if you can get lower dose of medication 3 )  get a pcp to assist with drug company patient assistance program applications
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
## 2016-01-26 NOTE — ED Notes (Signed)
Bed: [facility] Expected date:  Expected time:  Means of arrival:  Comments: Seizure 25 y/o F

## 2016-01-26 NOTE — ED Triage Notes (Signed)
Patient here with complaints of 2 seizures today totaling  5-6 min combined. Takes keppra. Noncompliant, unable to afford meds. Denies falling. 10/10 neck pain.

## 2016-03-30 ENCOUNTER — Emergency Department (HOSPITAL_COMMUNITY): Payer: Self-pay

## 2016-03-30 ENCOUNTER — Encounter (HOSPITAL_COMMUNITY): Payer: Self-pay | Admitting: Emergency Medicine

## 2016-03-30 ENCOUNTER — Emergency Department (HOSPITAL_COMMUNITY)
Admission: EM | Admit: 2016-03-30 | Discharge: 2016-03-30 | Disposition: A | Discharge status: Home / Self Care | Payer: Self-pay | Attending: Emergency Medicine | Admitting: Emergency Medicine

## 2016-03-30 DIAGNOSIS — R0789 Other chest pain: Secondary | ICD-10-CM | POA: Insufficient documentation

## 2016-03-30 DIAGNOSIS — Z79899 Other long term (current) drug therapy: Secondary | ICD-10-CM | POA: Insufficient documentation

## 2016-03-30 DIAGNOSIS — F17200 Nicotine dependence, unspecified, uncomplicated: Secondary | ICD-10-CM | POA: Insufficient documentation

## 2016-03-30 DIAGNOSIS — D509 Iron deficiency anemia, unspecified: Secondary | ICD-10-CM | POA: Insufficient documentation

## 2016-03-30 LAB — BASIC METABOLIC PANEL
Anion gap: 7 (ref 5–15)
BUN: 13 mg/dL (ref 6–20)
CHLORIDE: 105 mmol/L (ref 101–111)
CO2: 26 mmol/L (ref 22–32)
Calcium: 9.1 mg/dL (ref 8.9–10.3)
Creatinine, Ser: 1.03 mg/dL — ABNORMAL HIGH (ref 0.44–1.00)
GFR calc Af Amer: >60 mL/min (ref >60)
GFR calc non Af Amer: >60 mL/min (ref >60)
GLUCOSE: 94 mg/dL (ref 65–99)
POTASSIUM: 3.5 mmol/L (ref 3.5–5.1)
Sodium: 138 mmol/L (ref 135–145)

## 2016-03-30 LAB — CBC
HEMATOCRIT: 32 % — AB (ref 36.0–46.0)
Hemoglobin: 10 g/dL — ABNORMAL LOW (ref 12.0–15.0)
MCH: 19.6 pg — ABNORMAL LOW (ref 26.0–34.0)
MCHC: 31.3 g/dL (ref 30.0–36.0)
MCV: 62.6 fL — AB (ref 78.0–100.0)
Platelets: 216 10*3/uL (ref 150–400)
RBC: 5.11 MIL/uL (ref 3.87–5.11)
RDW: 19.3 % — AB (ref 11.5–15.5)
WBC: 5.9 10*3/uL (ref 4.0–10.5)

## 2016-03-30 LAB — I-STAT TROPONIN, ED
Troponin i, poc: 0 ng/mL (ref 0.00–0.08)
Troponin i, poc: 0.17 ng/mL (ref 0.00–0.08)

## 2016-03-30 LAB — TROPONIN I: Troponin I: <0.03 ng/mL (ref <0.03)

## 2016-03-30 LAB — D-DIMER, QUANTITATIVE: D-Dimer, Quant: <0.27 ug/mL-FEU (ref 0.00–0.50)

## 2016-03-30 MED ORDER — KETOROLAC TROMETHAMINE 30 MG/ML IJ SOLN
30.0000 mg | Freq: Once | INTRAMUSCULAR | Status: AC
  Administered 2016-03-30: 30 mg via INTRAVENOUS
  Filled 2016-03-30: qty 1

## 2016-03-30 MED ORDER — DEXAMETHASONE SODIUM PHOSPHATE 10 MG/ML IJ SOLN
10.0000 mg | Freq: Once | INTRAMUSCULAR | Status: AC
  Administered 2016-03-30: 10 mg via INTRAVENOUS
  Filled 2016-03-30: qty 1

## 2016-03-30 MED ORDER — IBUPROFEN 800 MG PO TABS: 800.0000 mg | ORAL_TABLET | Freq: Three times a day (TID) | ORAL | 0 refills | Status: DC

## 2016-03-30 MED ORDER — TRAMADOL HCL 50 MG PO TABS: 50.0000 mg | ORAL_TABLET | Freq: Four times a day (QID) | ORAL | 0 refills | Status: DC | PRN

## 2016-03-30 MED ORDER — ASPIRIN 81 MG PO CHEW
324.0000 mg | CHEWABLE_TABLET | Freq: Once | ORAL | Status: AC
  Administered 2016-03-30: 324 mg via ORAL
  Filled 2016-03-30: qty 4
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
## 2016-03-30 NOTE — ED Triage Notes (Signed)
Patient reports central chest pain / heaviness with SOB " hard to breath " onset today , denies cough , no nausea or diaphoresis , pain radiating to mid back .

## 2016-03-30 NOTE — ED Triage Notes (Signed)
Dr. Dina Rich and charge nurse notified on pt.'s elevated Troponin result .

## 2016-05-15 ENCOUNTER — Emergency Department (HOSPITAL_COMMUNITY)
Admission: EM | Admit: 2016-05-15 | Discharge: 2016-05-15 | Disposition: A | Discharge status: Home / Self Care | Payer: Medicaid Other | Attending: Emergency Medicine | Admitting: Emergency Medicine

## 2016-05-15 ENCOUNTER — Encounter (HOSPITAL_COMMUNITY): Payer: Self-pay

## 2016-05-15 DIAGNOSIS — J019 Acute sinusitis, unspecified: Secondary | ICD-10-CM | POA: Insufficient documentation

## 2016-05-15 DIAGNOSIS — F17200 Nicotine dependence, unspecified, uncomplicated: Secondary | ICD-10-CM | POA: Insufficient documentation

## 2016-05-15 DIAGNOSIS — R519 Headache, unspecified: Secondary | ICD-10-CM

## 2016-05-15 DIAGNOSIS — R51 Headache: Secondary | ICD-10-CM

## 2016-05-15 MED ORDER — ACETAMINOPHEN 500 MG PO TABS
1000.0000 mg | ORAL_TABLET | Freq: Once | ORAL | Status: AC
  Administered 2016-05-15: 1000 mg via ORAL
  Filled 2016-05-15: qty 2

## 2016-05-15 MED ORDER — OXYMETAZOLINE HCL 0.05 % NA SOLN
1.0000 | Freq: Once | NASAL | Status: AC
  Administered 2016-05-15: 1 via NASAL
  Filled 2016-05-15: qty 15

## 2016-05-15 MED ORDER — OXYMETAZOLINE HCL 0.05 % NA SOLN: 1.0000 | Freq: Two times a day (BID) | NASAL | 0 refills | Status: DC
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
## 2016-05-15 NOTE — ED Triage Notes (Signed)
Pt c/o of head pressure. Pt states she has had a runny nose but has not been sick.

## 2016-05-15 NOTE — Discharge Instructions (Signed)
Take your medication as prescribed. You may also take 1000mg  Tylenol every 4-6 hours and 600mg  Ibuprofen every 6 hours as needed for pain relief. You may also use warm saline nasal washes to help with your nasal congestion.  Please follow up with a primary care provider from the Resource Guide provided below in one week if her symptoms have not improved. Please return to the Emergency Department if symptoms worsen or new onset of fever, new/worsening headache, visual changes, facial swelling, neck stiffness, difficulty breathing, vomiting, unable to keep fluids down.

## 2016-06-03 ENCOUNTER — Emergency Department (HOSPITAL_COMMUNITY)
Admission: EM | Admit: 2016-06-03 | Discharge: 2016-06-03 | Disposition: A | Discharge status: Home / Self Care | Payer: Medicaid Other | Attending: Emergency Medicine | Admitting: Emergency Medicine

## 2016-06-03 ENCOUNTER — Encounter (HOSPITAL_COMMUNITY): Payer: Self-pay

## 2016-06-03 DIAGNOSIS — Z79899 Other long term (current) drug therapy: Secondary | ICD-10-CM | POA: Insufficient documentation

## 2016-06-03 DIAGNOSIS — R509 Fever, unspecified: Secondary | ICD-10-CM | POA: Insufficient documentation

## 2016-06-03 DIAGNOSIS — Z0189 Encounter for other specified special examinations: Secondary | ICD-10-CM | POA: Insufficient documentation

## 2016-06-03 DIAGNOSIS — F17200 Nicotine dependence, unspecified, uncomplicated: Secondary | ICD-10-CM | POA: Insufficient documentation

## 2016-06-03 DIAGNOSIS — Z7689 Persons encountering health services in other specified circumstances: Secondary | ICD-10-CM

## 2016-06-03 NOTE — Discharge Instructions (Signed)
We are giving you a note to return to work. If you have any reoccurrence of your fever or other problems follow up with your doctor or return here as needed.

## 2016-06-03 NOTE — ED Notes (Signed)
Pt stable, ambulatory, states understanding of discharge instructions 

## 2016-06-03 NOTE — ED Triage Notes (Signed)
Pt states had fever 2 days ago. Pt states needs work note in order to return. Pt afebrile at triage. Pt denies any cough or chills at this time.
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
## 2016-06-06 NOTE — L&D Delivery Note (Signed)
Delivery Note At 10:18 PM a viable female was delivered via Vaginal, Spontaneous Delivery (Presentation: LOA  ).  APGAR: 8, 9; weight Pending Placenta status: manually removed and was intact. Cord: 3 vessel, 60s delayed cord clamping without complications.   Anesthesia: none Episiotomy: None Lacerations: Labial Suture Repair: 3.0 Est. Blood Loss (mL): 400  Mom to postpartum.  Baby to Couplet care / Skin to Skin.  Eloise Levels 03/16/2017, 11:29 PM  The above was performed under my direct supervision and guidance.

## 2016-06-08 ENCOUNTER — Encounter (HOSPITAL_COMMUNITY): Payer: Self-pay | Admitting: Emergency Medicine

## 2016-06-08 ENCOUNTER — Emergency Department (HOSPITAL_COMMUNITY)
Admission: EM | Admit: 2016-06-08 | Discharge: 2016-06-08 | Disposition: A | Discharge status: Home / Self Care | Payer: Medicaid Other | Attending: Emergency Medicine | Admitting: Emergency Medicine

## 2016-06-08 ENCOUNTER — Emergency Department (HOSPITAL_COMMUNITY): Payer: Medicaid Other

## 2016-06-08 DIAGNOSIS — R059 Cough, unspecified: Secondary | ICD-10-CM

## 2016-06-08 DIAGNOSIS — R05 Cough: Secondary | ICD-10-CM | POA: Insufficient documentation

## 2016-06-08 DIAGNOSIS — F17200 Nicotine dependence, unspecified, uncomplicated: Secondary | ICD-10-CM | POA: Insufficient documentation

## 2016-06-08 MED ORDER — BENZONATATE 100 MG PO CAPS: 100.0000 mg | ORAL_CAPSULE | Freq: Three times a day (TID) | ORAL | 0 refills | Status: DC

## 2016-06-08 MED ORDER — RANITIDINE HCL 150 MG PO TABS: 150.0000 mg | ORAL_TABLET | Freq: Two times a day (BID) | ORAL | 0 refills | Status: DC

## 2016-06-08 NOTE — ED Triage Notes (Signed)
Pt sts productive cough today with blood tinged sputum; pt denies fever

## 2016-06-08 NOTE — ED Notes (Signed)
Patient transported to X-ray 
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
## 2016-06-08 NOTE — Discharge Instructions (Signed)
The bright red blood you saw when you coughed may have been due to irritation of the esophagus during the hard coughing. I am starting you on Zantac and a cough medication. Return as needed for worsening symptoms.

## 2016-06-08 NOTE — ED Notes (Signed)
Video visit coupon number [id number] and handout given to patient on discharge

## 2016-06-12 ENCOUNTER — Emergency Department (HOSPITAL_COMMUNITY)
Admission: EM | Admit: 2016-06-12 | Discharge: 2016-06-12 | Disposition: A | Discharge status: Home / Self Care | Payer: Medicaid Other | Attending: Emergency Medicine | Admitting: Emergency Medicine

## 2016-06-12 ENCOUNTER — Encounter (HOSPITAL_COMMUNITY): Payer: Self-pay

## 2016-06-12 DIAGNOSIS — F17200 Nicotine dependence, unspecified, uncomplicated: Secondary | ICD-10-CM | POA: Insufficient documentation

## 2016-06-12 DIAGNOSIS — G40909 Epilepsy, unspecified, not intractable, without status epilepticus: Secondary | ICD-10-CM | POA: Insufficient documentation

## 2016-06-12 DIAGNOSIS — Z79899 Other long term (current) drug therapy: Secondary | ICD-10-CM | POA: Insufficient documentation

## 2016-06-12 LAB — CBG MONITORING, ED: Glucose-Capillary: 87 mg/dL (ref 65–99)
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
## 2016-06-12 NOTE — ED Notes (Signed)
Patient states that while she was sleeping last night her bf stated that she had 4 seizures within 5 minutes. Patient reports history of seizures, states she is unable to afford keppra. Patient came in today to make sure she could go to work in a hour. Patient is alert and oriented x 4. Denies any injury during seizures.

## 2016-06-12 NOTE — ED Triage Notes (Signed)
Patient here with reported pseudo seizure x 4 during the night per significant other. States she was to take keppra but hasn't taken x 2 years. On arrival alert and oriented, no trauma noted, denies pain

## 2016-06-12 NOTE — ED Notes (Signed)
CBG 87. 

## 2016-06-21 ENCOUNTER — Emergency Department (HOSPITAL_COMMUNITY)
Admission: EM | Admit: 2016-06-21 | Discharge: 2016-06-21 | Disposition: A | Discharge status: Home / Self Care | Payer: Medicaid Other | Attending: Emergency Medicine | Admitting: Emergency Medicine

## 2016-06-21 ENCOUNTER — Encounter (HOSPITAL_COMMUNITY): Payer: Self-pay

## 2016-06-21 DIAGNOSIS — H6122 Impacted cerumen, left ear: Secondary | ICD-10-CM | POA: Insufficient documentation

## 2016-06-21 DIAGNOSIS — F17200 Nicotine dependence, unspecified, uncomplicated: Secondary | ICD-10-CM | POA: Insufficient documentation

## 2016-06-21 DIAGNOSIS — J40 Bronchitis, not specified as acute or chronic: Secondary | ICD-10-CM | POA: Insufficient documentation

## 2016-06-21 LAB — D-DIMER, QUANTITATIVE (NOT AT ARMC)

## 2016-06-21 MED ORDER — GUAIFENESIN-CODEINE 100-10 MG/5ML PO SYRP: 5.0000 mL | ORAL_SOLUTION | Freq: Three times a day (TID) | ORAL | 0 refills | Status: DC | PRN

## 2016-06-21 MED ORDER — AZITHROMYCIN 250 MG PO TABS
250.0000 mg | ORAL_TABLET | Freq: Every day | ORAL | 0 refills | Status: DC
Start: 2016-06-21 — End: 2016-10-14
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
## 2016-06-21 NOTE — Discharge Instructions (Signed)
The cough medication can make you sleepy.

## 2016-06-21 NOTE — ED Triage Notes (Signed)
Patient here with recurrent cough. Seen and treated for bronchitis and thinks she has it again. States that she noticed blood in sputum, NAD

## 2016-06-21 NOTE — ED Notes (Signed)
Declined W/C at D/C and was escorted to lobby by RN. 

## 2016-09-11 ENCOUNTER — Encounter (HOSPITAL_COMMUNITY): Payer: Self-pay | Admitting: Emergency Medicine

## 2016-09-11 ENCOUNTER — Encounter (HOSPITAL_COMMUNITY): Payer: Self-pay

## 2016-09-11 ENCOUNTER — Emergency Department (HOSPITAL_COMMUNITY)
Admission: EM | Admit: 2016-09-11 | Discharge: 2016-09-11 | Disposition: A | Discharge status: Home / Self Care | Payer: Medicaid Other | Attending: Emergency Medicine | Admitting: Emergency Medicine

## 2016-09-11 DIAGNOSIS — F17200 Nicotine dependence, unspecified, uncomplicated: Secondary | ICD-10-CM | POA: Diagnosis not present

## 2016-09-11 DIAGNOSIS — K0889 Other specified disorders of teeth and supporting structures: Secondary | ICD-10-CM | POA: Insufficient documentation

## 2016-09-11 DIAGNOSIS — O99331 Smoking (tobacco) complicating pregnancy, first trimester: Secondary | ICD-10-CM | POA: Insufficient documentation

## 2016-09-11 DIAGNOSIS — Z3A11 11 weeks gestation of pregnancy: Secondary | ICD-10-CM | POA: Insufficient documentation

## 2016-09-11 DIAGNOSIS — O99611 Diseases of the digestive system complicating pregnancy, first trimester: Secondary | ICD-10-CM | POA: Insufficient documentation

## 2016-09-11 MED ORDER — AMOXICILLIN 500 MG PO CAPS: 500.0000 mg | ORAL_CAPSULE | Freq: Three times a day (TID) | ORAL | 0 refills | Status: DC
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
## 2016-09-11 NOTE — ED Notes (Signed)
Pt checked in at 1530

## 2016-09-11 NOTE — ED Triage Notes (Signed)
Pt arrived at 1530, see CSN: [id number], MRN: [id number] that was deleted by error.

## 2016-09-11 NOTE — ED Notes (Signed)
Izora Gala, RN     Pt c/o left lower toothache with facial swelling onset 5 days ago. Pt has not taken any medications due to being pregnant. Pt is due March 27, 2017.

## 2016-09-11 NOTE — ED Triage Notes (Signed)
Pt c/o left lower toothache with facial swelling onset 5 days ago. Pt has not taken any medications due to being pregnant. Pt is due March 27, 2017.

## 2016-09-12 LAB — OB RESULTS CONSOLE HGB/HCT, BLOOD
HEMATOCRIT: 35 %
HEMOGLOBIN: 10.4 g/dL

## 2016-09-12 LAB — OB RESULTS CONSOLE PLATELET COUNT: Platelets: 317 10*3/uL

## 2016-09-12 LAB — OB RESULTS CONSOLE VARICELLA ZOSTER ANTIBODY, IGG: Varicella: IMMUNE

## 2016-09-12 LAB — OB RESULTS CONSOLE GC/CHLAMYDIA
Chlamydia: NEGATIVE
Gonorrhea: NEGATIVE

## 2016-09-12 LAB — OB RESULTS CONSOLE ABO/RH: RH TYPE: POSITIVE

## 2016-09-12 LAB — SICKLE CELL SCREEN: Sickle Cell Screen: NORMAL

## 2016-09-12 LAB — OB RESULTS CONSOLE HEPATITIS B SURFACE ANTIGEN: Hepatitis B Surface Ag: NEGATIVE

## 2016-09-12 LAB — OB RESULTS CONSOLE ANTIBODY SCREEN: Antibody Screen: NEGATIVE

## 2016-09-12 LAB — OB RESULTS CONSOLE HIV ANTIBODY (ROUTINE TESTING): HIV: NONREACTIVE

## 2016-09-12 LAB — OB RESULTS CONSOLE RPR: RPR: NONREACTIVE

## 2016-09-12 LAB — CYSTIC FIBROSIS DIAGNOSTIC STUDY: INTERPRETATION-CFDNA: NEGATIVE

## 2016-09-12 LAB — OB RESULTS CONSOLE RUBELLA ANTIBODY, IGM: Rubella: NON-IMMUNE/NOT IMMUNE

## 2016-09-13 ENCOUNTER — Other Ambulatory Visit (HOSPITAL_COMMUNITY): Payer: Self-pay | Admitting: Nurse Practitioner

## 2016-09-13 DIAGNOSIS — Z3682 Encounter for antenatal screening for nuchal translucency: Secondary | ICD-10-CM

## 2016-09-22 ENCOUNTER — Encounter (HOSPITAL_COMMUNITY): Payer: Self-pay

## 2016-09-22 ENCOUNTER — Ambulatory Visit (HOSPITAL_COMMUNITY)
Admission: RE | Admit: 2016-09-22 | Discharge: 2016-09-22 | Disposition: A | Discharge status: Home / Self Care | Payer: Medicaid Other | Source: Ambulatory Visit | Attending: Nurse Practitioner | Admitting: Nurse Practitioner

## 2016-09-22 ENCOUNTER — Ambulatory Visit (HOSPITAL_COMMUNITY): Payer: Medicaid Other

## 2016-09-26 ENCOUNTER — Encounter: Payer: Self-pay | Admitting: Obstetrics and Gynecology

## 2016-09-29 ENCOUNTER — Encounter: Payer: Self-pay | Admitting: *Deleted

## 2016-10-03 ENCOUNTER — Telehealth: Payer: Self-pay | Admitting: Obstetrics & Gynecology

## 2016-10-03 NOTE — Telephone Encounter (Signed)
Patient missed her New OB appt on Monday September 24, 2016. I called and left the patient a message to call me back so we can get her Appt rescheduled, Patient returned my call this morning, she is aware that her New Appt is on Oct 14, 2016.   Lorriane Shire [place]

## 2016-10-14 ENCOUNTER — Encounter: Payer: Self-pay | Admitting: Obstetrics & Gynecology

## 2016-10-14 ENCOUNTER — Encounter: Payer: Self-pay | Admitting: *Deleted

## 2016-10-14 ENCOUNTER — Ambulatory Visit (INDEPENDENT_AMBULATORY_CARE_PROVIDER_SITE_OTHER): Payer: Medicaid Other | Admitting: Obstetrics & Gynecology

## 2016-10-14 DIAGNOSIS — O0992 Supervision of high risk pregnancy, unspecified, second trimester: Secondary | ICD-10-CM

## 2016-10-14 DIAGNOSIS — O99350 Diseases of the nervous system complicating pregnancy, unspecified trimester: Secondary | ICD-10-CM | POA: Diagnosis not present

## 2016-10-14 DIAGNOSIS — G40909 Epilepsy, unspecified, not intractable, without status epilepticus: Secondary | ICD-10-CM

## 2016-10-14 DIAGNOSIS — O0990 Supervision of high risk pregnancy, unspecified, unspecified trimester: Secondary | ICD-10-CM | POA: Insufficient documentation

## 2016-10-14 NOTE — Progress Notes (Signed)
Initial prenatal info packet given Declines flu Declines Babyscripts--has own app already
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
## 2016-10-14 NOTE — Patient Instructions (Signed)
Pregnancy and Epilepsy Epilepsy is a lifelong (chronic) disease that causes repeated seizures. A brain dysfunction makes nerve cells produce too many electrical discharges, causing the seizures. These discharges are unpredictable and can make the body shake uncontrollably. There is no way to tell whether pregnancy will make your epilepsy worse. With the right care, however, chances are good you will have a normal, healthy baby. If you are planning to get pregnant, work with your health care provider and go to all your prenatal appointments. What are the risks? Most women with epilepsy have safe pregnancies and healthy babies, but having the condition does mean there are some additional risks. These include:  Uncontrolled nausea and vomiting that does not go away (hyperemesis gravidarum).  Vaginal bleeding.  Anemia.  Endocrine hormone disorders.  Premature labor.  Failure to progress in labor.  Increased chance of cesarean delivery.  A slightly increased risk for birth defects. What if I have a seizure while I am pregnant? A seizure during pregnancy can reduce blood supply to your baby and may increase your risk for early labor. You will likely have fewer or the same number of seizures during pregnancy. However, there is a small chance that you will have more seizures. This may be from hormonal changes, stress, or a lower dose of seizure medicine. To prevent seizures, your health care provider will do frequent blood tests to make sure your antiepileptic drug is at a safe level. What are the treatment options for epilepsy while I am pregnant? Your health care provider can answer questions about your epilepsy treatment if you are pregnant or planning a pregnancy.  Follow your health care provider's treatment advice.  Take your antiepileptic drug as directed by your health care provider.  You may have to take higher-than-normal doses of folic acid. This is to help protect your baby from  spinal cord defects.  Keep all your appointments for prenatal care and checkups for epilepsy. Will my medicine affect my baby? To protect your baby, your health care provider will prescribe the safest antiepileptic drug at the lowest dose that will still prevent seizures. Your baby will also be checked often to make sure everything is okay. Antiepileptic drugs are used during pregnancy because the risk of a seizure harming your baby is higher than the risk of the medicine harming your baby. If you have not had any seizures for several years, you may not need to take an antiepileptic drug. Will I be able to breastfeed my baby? Women with epilepsy are encouraged to breastfeed. Your antiepileptic drug may pass through your breast milk in small amounts, but it is usually not enough to affect your baby. Take your antiepileptic drug after you breastfeed your baby, not before. When should I seek medical care? If you are pregnant and have epilepsy, you should contact your health care provider if:  You think you are having minor or isolated seizures.  You do not feel the baby moving as much as usual.  You develop tiredness or weakness, or you feel faint. When should I seek immediate medical care? You should seek emergency care right away if:  You have very bad abdominal pain.  You have vaginal bleeding, with or without abdominal pain.  You do not feel the baby moving.  You have very bad headaches.  You have vision problems.  You have numbness on your body.  You cannot stop vomiting. This information is not intended to replace advice given to you by your health care provider.  Make sure you discuss any questions you have with your health care provider. Document Released: 05/20/2000 Document Revised: 10/29/2015 Document Reviewed: 04/26/2013 Elsevier Interactive Patient Education  2017 [employer].

## 2016-10-17 ENCOUNTER — Telehealth: Payer: Self-pay | Admitting: *Deleted

## 2016-10-17 ENCOUNTER — Encounter: Payer: Self-pay | Admitting: Neurology

## 2016-10-17 NOTE — Telephone Encounter (Addendum)
Scheduled patient to see Dr Delice Lesch at [facility] 6/29 @ 0845. Attempted to call patient with appt, there was no answer. Left voice mail stating I am calling with information  About an appointment. Please return my call at the clinic.

## 2016-10-18 ENCOUNTER — Encounter: Payer: Self-pay | Admitting: Obstetrics & Gynecology

## 2016-10-21 ENCOUNTER — Emergency Department (HOSPITAL_COMMUNITY)
Admission: EM | Admit: 2016-10-21 | Discharge: 2016-10-21 | Disposition: A | Discharge status: Home / Self Care | Payer: Medicaid Other | Attending: Emergency Medicine | Admitting: Emergency Medicine

## 2016-10-21 ENCOUNTER — Emergency Department (HOSPITAL_COMMUNITY): Payer: Medicaid Other

## 2016-10-21 ENCOUNTER — Encounter (HOSPITAL_COMMUNITY): Payer: Self-pay

## 2016-10-21 DIAGNOSIS — R1032 Left lower quadrant pain: Secondary | ICD-10-CM | POA: Insufficient documentation

## 2016-10-21 DIAGNOSIS — Z87891 Personal history of nicotine dependence: Secondary | ICD-10-CM | POA: Insufficient documentation

## 2016-10-21 DIAGNOSIS — R52 Pain, unspecified: Secondary | ICD-10-CM

## 2016-10-21 DIAGNOSIS — O26892 Other specified pregnancy related conditions, second trimester: Secondary | ICD-10-CM | POA: Diagnosis not present

## 2016-10-21 DIAGNOSIS — O0281 Inappropriate change in quantitative human chorionic gonadotropin (hCG) in early pregnancy: Secondary | ICD-10-CM | POA: Diagnosis not present

## 2016-10-21 DIAGNOSIS — Z3A18 18 weeks gestation of pregnancy: Secondary | ICD-10-CM | POA: Diagnosis not present

## 2016-10-21 LAB — CBC WITH DIFFERENTIAL/PLATELET
BASOS ABS: 0 10*3/uL (ref 0.0–0.1)
BASOS PCT: 0 %
EOS ABS: 0.2 10*3/uL (ref 0.0–0.7)
Eosinophils Relative: 3 %
HCT: 31.1 % — ABNORMAL LOW (ref 36.0–46.0)
HEMOGLOBIN: 9.9 g/dL — AB (ref 12.0–15.0)
LYMPHS PCT: 27 %
Lymphs Abs: 1.7 10*3/uL (ref 0.7–4.0)
MCH: 21.8 pg — AB (ref 26.0–34.0)
MCHC: 31.8 g/dL (ref 30.0–36.0)
MCV: 68.5 fL — ABNORMAL LOW (ref 78.0–100.0)
Monocytes Absolute: 0.5 10*3/uL (ref 0.1–1.0)
Monocytes Relative: 8 %
Neutro Abs: 3.9 10*3/uL (ref 1.7–7.7)
Neutrophils Relative %: 62 %
Platelets: 251 10*3/uL (ref 150–400)
RBC: 4.54 MIL/uL (ref 3.87–5.11)
RDW: 21.5 % — ABNORMAL HIGH (ref 11.5–15.5)
WBC: 6.3 10*3/uL (ref 4.0–10.5)

## 2016-10-21 LAB — I-STAT CHEM 8, ED
CHLORIDE: 101 mmol/L (ref 101–111)
Calcium, Ion: 1.16 mmol/L (ref 1.15–1.40)
Creatinine, Ser: 0.5 mg/dL (ref 0.44–1.00)
Glucose, Bld: 80 mg/dL (ref 65–99)
HEMATOCRIT: 31 % — AB (ref 36.0–46.0)
Hemoglobin: 10.5 g/dL — ABNORMAL LOW (ref 12.0–15.0)
Potassium: 4.2 mmol/L (ref 3.5–5.1)
SODIUM: 136 mmol/L (ref 135–145)
TCO2: 26 mmol/L (ref 0–100)

## 2016-10-21 LAB — URINALYSIS, ROUTINE W REFLEX MICROSCOPIC
Bilirubin Urine: NEGATIVE
Glucose, UA: NEGATIVE mg/dL
Hgb urine dipstick: NEGATIVE
Ketones, ur: NEGATIVE mg/dL
Leukocytes, UA: NEGATIVE
Nitrite: NEGATIVE
Protein, ur: NEGATIVE mg/dL
SPECIFIC GRAVITY, URINE: 1.005 (ref 1.005–1.030)
pH: 8 (ref 5.0–8.0)

## 2016-10-21 LAB — WET PREP, GENITAL
Sperm: NONE SEEN
TRICH WET PREP: NONE SEEN
YEAST WET PREP: NONE SEEN

## 2016-10-21 LAB — HCG, QUANTITATIVE, PREGNANCY: hCG, Beta Chain, Quant, S: 15962 m[IU]/mL — ABNORMAL HIGH (ref <5)

## 2016-10-21 MED ORDER — SODIUM CHLORIDE 0.9 % IV BOLUS (SEPSIS)
1000.0000 mL | Freq: Once | INTRAVENOUS | Status: AC
  Administered 2016-10-21: 1000 mL via INTRAVENOUS

## 2016-10-21 MED ORDER — ACETAMINOPHEN 325 MG PO TABS
650.0000 mg | ORAL_TABLET | Freq: Once | ORAL | Status: AC
  Administered 2016-10-21: 650 mg via ORAL
  Filled 2016-10-21: qty 2

## 2016-10-21 NOTE — ED Notes (Signed)
Pt taken to US
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
## 2016-10-21 NOTE — ED Notes (Signed)
Pt back from US

## 2016-10-21 NOTE — ED Triage Notes (Signed)
Per Pt, Pt is coming from home with complaints of LLQ sharp abdominal pain. Pt is self-reported to be [redacted] weeks pregnant. Denies vaginal discharge, N/V/D

## 2016-10-24 LAB — GC/CHLAMYDIA PROBE AMP (~~LOC~~) NOT AT ARMC
CHLAMYDIA, DNA PROBE: NEGATIVE
Neisseria Gonorrhea: NEGATIVE

## 2016-10-25 ENCOUNTER — Encounter (HOSPITAL_COMMUNITY): Payer: Self-pay

## 2016-10-25 ENCOUNTER — Other Ambulatory Visit: Payer: Self-pay | Admitting: Obstetrics & Gynecology

## 2016-10-25 ENCOUNTER — Ambulatory Visit (HOSPITAL_COMMUNITY)
Admission: RE | Admit: 2016-10-25 | Discharge: 2016-10-25 | Disposition: A | Discharge status: Home / Self Care | Payer: Medicaid Other | Source: Ambulatory Visit | Attending: Obstetrics & Gynecology | Admitting: Obstetrics & Gynecology

## 2016-10-25 VITALS — BP 108/66 | HR 72 | Wt 142.0 lb

## 2016-10-25 DIAGNOSIS — Z3A18 18 weeks gestation of pregnancy: Secondary | ICD-10-CM | POA: Diagnosis not present

## 2016-10-25 DIAGNOSIS — O0992 Supervision of high risk pregnancy, unspecified, second trimester: Secondary | ICD-10-CM | POA: Diagnosis present

## 2016-10-25 DIAGNOSIS — O99350 Diseases of the nervous system complicating pregnancy, unspecified trimester: Secondary | ICD-10-CM | POA: Insufficient documentation

## 2016-10-25 DIAGNOSIS — G40909 Epilepsy, unspecified, not intractable, without status epilepticus: Secondary | ICD-10-CM | POA: Diagnosis not present

## 2016-10-25 DIAGNOSIS — Z369 Encounter for antenatal screening, unspecified: Secondary | ICD-10-CM

## 2016-10-25 DIAGNOSIS — Z363 Encounter for antenatal screening for malformations: Secondary | ICD-10-CM | POA: Diagnosis not present
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
## 2016-10-28 ENCOUNTER — Other Ambulatory Visit (HOSPITAL_COMMUNITY): Payer: Self-pay | Admitting: *Deleted

## 2016-10-28 DIAGNOSIS — O99352 Diseases of the nervous system complicating pregnancy, second trimester: Principal | ICD-10-CM

## 2016-10-28 DIAGNOSIS — G40909 Epilepsy, unspecified, not intractable, without status epilepticus: Secondary | ICD-10-CM

## 2016-10-28 NOTE — Telephone Encounter (Signed)
Called pt and informed her of appt @ [facility].  She voiced understanding and had no questions.

## 2016-11-14 ENCOUNTER — Ambulatory Visit (INDEPENDENT_AMBULATORY_CARE_PROVIDER_SITE_OTHER): Payer: Medicaid Other | Admitting: Obstetrics & Gynecology

## 2016-11-14 DIAGNOSIS — O0990 Supervision of high risk pregnancy, unspecified, unspecified trimester: Secondary | ICD-10-CM

## 2016-11-14 DIAGNOSIS — O0992 Supervision of high risk pregnancy, unspecified, second trimester: Secondary | ICD-10-CM | POA: Diagnosis present

## 2016-11-14 DIAGNOSIS — O99352 Diseases of the nervous system complicating pregnancy, second trimester: Secondary | ICD-10-CM

## 2016-11-14 DIAGNOSIS — G40909 Epilepsy, unspecified, not intractable, without status epilepticus: Secondary | ICD-10-CM | POA: Diagnosis not present

## 2016-11-14 NOTE — Progress Notes (Signed)
   PRENATAL VISIT NOTE  Subjective:  Candace Barnes is a 26 y.o. G1P0000 at [redacted]w[redacted]d being seen today for ongoing prenatal care.  She is currently monitored for the following issues for this high-risk pregnancy and has PITUITARY ADENOMA, BENIGN; Depression; Migraine; Seizure ([place]); Supervision of high risk pregnancy, antepartum; and Seizure disorder during pregnancy, antepartum ([place]) on her problem list.  Patient reports questionable seizure activity noted one night during sleep.  Contractions: Irritability. Vag. Bleeding: None.  Movement: Present. Denies leaking of fluid.   The following portions of the patient's history were reviewed and updated as appropriate: allergies, current medications, past family history, past medical history, past social history, past surgical history and problem list. Problem list updated.  Objective:   Vitals:   11/14/16 1122  BP: 124/81  Pulse: 80  Weight: 146 lb 12.8 oz (66.6 kg)    Fetal Status: Fetal Heart Rate (bpm): 141   Movement: Present     General:  Alert, oriented and cooperative. Patient is in no acute distress.  Skin: Skin is warm and dry. No rash noted.   Cardiovascular: Normal heart rate noted  Respiratory: Normal respiratory effort, no problems with respiration noted  Abdomen: Soft, gravid, appropriate for gestational age. Pain/Pressure: Present     Pelvic:  Cervical exam deferred        Extremities: Normal range of motion.  Edema: None  Mental Status: Normal mood and affect. Normal behavior. Normal judgment and thought content.   Assessment and Plan:  Pregnancy: G1P0000 at [redacted]w[redacted]d  1. Supervision of high risk pregnancy, antepartum [place] nl 5/22. Dating recalculated   Preterm labor symptoms and general obstetric precautions including but not limited to vaginal bleeding, contractions, leaking of fluid and fetal movement were reviewed in detail with the patient. Please refer to After Visit Summary for other counseling recommendations.  Return in  about 4 weeks (around 12/12/2016). F/U anatomy US 6/19, neurology appt 6/29  Emeterio Reeve, MD

## 2016-11-14 NOTE — Patient Instructions (Signed)

## 2016-11-14 NOTE — Progress Notes (Signed)
Breastfeeding tip reviewed

## 2016-11-22 ENCOUNTER — Other Ambulatory Visit (HOSPITAL_COMMUNITY): Payer: Self-pay | Admitting: *Deleted

## 2016-11-22 ENCOUNTER — Other Ambulatory Visit (HOSPITAL_COMMUNITY): Payer: Self-pay | Admitting: Maternal and Fetal Medicine

## 2016-11-22 ENCOUNTER — Encounter (HOSPITAL_COMMUNITY): Payer: Self-pay

## 2016-11-22 ENCOUNTER — Ambulatory Visit (HOSPITAL_COMMUNITY)
Admission: RE | Admit: 2016-11-22 | Discharge: 2016-11-22 | Disposition: A | Discharge status: Home / Self Care | Payer: Medicaid Other | Source: Ambulatory Visit | Attending: Obstetrics & Gynecology | Admitting: Obstetrics & Gynecology

## 2016-11-22 DIAGNOSIS — Z0489 Encounter for examination and observation for other specified reasons: Secondary | ICD-10-CM

## 2016-11-22 DIAGNOSIS — IMO0002 Reserved for concepts with insufficient information to code with codable children: Secondary | ICD-10-CM

## 2016-11-22 DIAGNOSIS — G40909 Epilepsy, unspecified, not intractable, without status epilepticus: Secondary | ICD-10-CM | POA: Diagnosis not present

## 2016-11-22 DIAGNOSIS — O99352 Diseases of the nervous system complicating pregnancy, second trimester: Principal | ICD-10-CM

## 2016-11-22 DIAGNOSIS — Z3A22 22 weeks gestation of pregnancy: Secondary | ICD-10-CM

## 2016-11-22 DIAGNOSIS — Z048 Encounter for examination and observation for other specified reasons: Secondary | ICD-10-CM | POA: Insufficient documentation

## 2016-11-22 DIAGNOSIS — Z3686 Encounter for antenatal screening for cervical length: Secondary | ICD-10-CM

## 2016-11-22 DIAGNOSIS — O99353 Diseases of the nervous system complicating pregnancy, third trimester: Principal | ICD-10-CM

## 2016-11-22 DIAGNOSIS — Z3A23 23 weeks gestation of pregnancy: Secondary | ICD-10-CM

## 2016-12-02 ENCOUNTER — Telehealth: Payer: Self-pay

## 2016-12-02 ENCOUNTER — Ambulatory Visit (INDEPENDENT_AMBULATORY_CARE_PROVIDER_SITE_OTHER): Payer: Medicaid Other | Admitting: Neurology

## 2016-12-02 ENCOUNTER — Encounter: Payer: Self-pay | Admitting: Neurology

## 2016-12-02 VITALS — BP 100/58 | HR 96 | Ht 72.0 in | Wt 149.0 lb

## 2016-12-02 DIAGNOSIS — R569 Unspecified convulsions: Secondary | ICD-10-CM

## 2016-12-02 NOTE — Telephone Encounter (Signed)
-----   Message from Cameron Sprang, MD sent at 12/02/2016 10:18 AM EDT ----- Regarding: therapy I wanted her to see a therapist as well, can you pass along info to her for [facility] (send referral if they accept referrals, but I think it's walk-in, pls confirm with [facility]). Thanks
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
## 2016-12-02 NOTE — Patient Instructions (Signed)
1. Schedule 24-hour EEG 2. Continue prenatal vitamins and folic acid 3. Follow-up in 3 months, call for any changes  Seizure Precautions:. 1. If medication has been prescribed for you to prevent seizures, take it exactly as directed.  Do not stop taking the medicine without talking to your doctor first, even if you have not had a seizure in a long time.   2. Avoid activities in which a seizure would cause danger to yourself or to others.  Don't operate dangerous machinery, swim alone, or climb in high or dangerous places, such as on ladders, roofs, or girders.  Do not drive unless your doctor says you may.  3. If you have any warning that you may have a seizure, lay down in a safe place where you can't hurt yourself.    4.  No driving for 6 months from last seizure, as per [facility].   Please refer to the following link on the [place] website for more information: http://www.epilepsyfoundation.org/answerplace/Social/driving/drivingu.cfm   5.  Maintain good sleep hygiene. Avoid alcohol.  6.  Notify your neurology if you are planning pregnancy or if you become pregnant.  7.  Contact your doctor if you have any problems that may be related to the medicine you are taking.  8.  Call 911 and bring the patient back to the ED if:        A.  The seizure lasts longer than 5 minutes.       B.  The patient doesn't awaken shortly after the seizure  C.  The patient has new problems such as difficulty seeing, speaking or moving  D.  The patient was injured during the seizure  E.  The patient has a temperature over 102 F (39C)  F.  The patient vomited and now is having trouble breathing

## 2016-12-02 NOTE — Telephone Encounter (Signed)
Spoke with pt, relaying message below.  Gave her information for [employer].  Pt verbalized understanding

## 2016-12-08 ENCOUNTER — Ambulatory Visit (INDEPENDENT_AMBULATORY_CARE_PROVIDER_SITE_OTHER): Payer: Medicaid Other | Admitting: Neurology

## 2016-12-08 DIAGNOSIS — R569 Unspecified convulsions: Secondary | ICD-10-CM | POA: Diagnosis not present

## 2016-12-12 ENCOUNTER — Ambulatory Visit (INDEPENDENT_AMBULATORY_CARE_PROVIDER_SITE_OTHER): Payer: Medicaid Other | Admitting: Obstetrics & Gynecology

## 2016-12-12 VITALS — BP 126/71 | HR 88 | Wt 148.1 lb

## 2016-12-12 DIAGNOSIS — O0992 Supervision of high risk pregnancy, unspecified, second trimester: Secondary | ICD-10-CM

## 2016-12-12 DIAGNOSIS — O0990 Supervision of high risk pregnancy, unspecified, unspecified trimester: Secondary | ICD-10-CM

## 2016-12-12 MED ORDER — PREPLUS 27-1 MG PO TABS: 1.0000 | ORAL_TABLET | Freq: Every day | ORAL | 6 refills | Status: DC

## 2016-12-12 NOTE — Patient Instructions (Signed)

## 2016-12-12 NOTE — Progress Notes (Signed)
Educated pt on [place]

## 2016-12-12 NOTE — Progress Notes (Signed)
   PRENATAL VISIT NOTE  Subjective:  Candace Barnes is a 26 y.o. G1P0000 at [redacted]w[redacted]d being seen today for ongoing prenatal care.  She is currently monitored for the following issues for this high-risk pregnancy and has PITUITARY ADENOMA, BENIGN; Depression; Migraine; Seizure ([place]); Supervision of high risk pregnancy, antepartum; and Seizure disorder during pregnancy, antepartum ([place]) on her problem list.  Patient reports no complaints.  Contractions: Not present. Vag. Bleeding: None.  Movement: Present. Denies leaking of fluid. She has felt cramps and wonders if these are braxtonhix contractions    She had an EEG last week to assess for seizure activity. She is currently not on medication for seizure and is awaiting results of EEG.   The following portions of the patient's history were reviewed and updated as appropriate: allergies, current medications, past family history, past medical history, past social history, past surgical history and problem list. Problem list updated.  Objective:   Vitals:   12/12/16 1509  BP: 126/71  Pulse: 88  Weight: 148 lb 1.6 oz (67.2 kg)    Fetal Status: Fetal Heart Rate (bpm): 140   Movement: Present     Measured at: 26cm  General:  Alert, oriented and cooperative. Patient is in no acute distress.  Skin: Skin is warm and dry. No rash noted.   Cardiovascular: Normal heart rate noted Split S2 noted in the right second intercostal space with inspratory variation   Respiratory: Normal respiratory effort, no problems with respiration noted  Abdomen: Soft, gravid, appropriate for gestational age. Pain/Pressure: Present     Pelvic:  Cervical exam deferred        Extremities: Normal range of motion.  Edema: None  Mental Status: Normal mood and affect. Normal behavior. Normal judgment and thought content.   Assessment and Plan:  Pregnancy: G1P0000 at [redacted]w[redacted]d  1. Supervision of high risk pregnancy, antepartum -f/u 2-3 weeks  -wait for EEG results  -plan for 2  hr GTT   Rodney Booze, Medical Student

## 2016-12-30 ENCOUNTER — Encounter: Payer: Self-pay | Admitting: Neurology

## 2016-12-30 ENCOUNTER — Ambulatory Visit (INDEPENDENT_AMBULATORY_CARE_PROVIDER_SITE_OTHER): Payer: Medicaid Other | Admitting: Neurology

## 2016-12-30 VITALS — BP 112/62 | HR 82 | Ht 72.0 in | Wt 153.0 lb

## 2016-12-30 DIAGNOSIS — R569 Unspecified convulsions: Secondary | ICD-10-CM

## 2016-12-30 MED ORDER — LEVETIRACETAM 500 MG PO TABS: 500.0000 mg | ORAL_TABLET | Freq: Two times a day (BID) | ORAL | 6 refills | Status: DC
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
## 2017-01-02 ENCOUNTER — Ambulatory Visit (INDEPENDENT_AMBULATORY_CARE_PROVIDER_SITE_OTHER): Payer: Medicaid Other | Admitting: Obstetrics & Gynecology

## 2017-01-02 VITALS — BP 116/70 | HR 85 | Wt 151.9 lb

## 2017-01-02 DIAGNOSIS — O0990 Supervision of high risk pregnancy, unspecified, unspecified trimester: Secondary | ICD-10-CM

## 2017-01-02 DIAGNOSIS — G40909 Epilepsy, unspecified, not intractable, without status epilepticus: Secondary | ICD-10-CM

## 2017-01-02 DIAGNOSIS — O99353 Diseases of the nervous system complicating pregnancy, third trimester: Secondary | ICD-10-CM

## 2017-01-02 DIAGNOSIS — O99350 Diseases of the nervous system complicating pregnancy, unspecified trimester: Secondary | ICD-10-CM

## 2017-01-02 DIAGNOSIS — O0993 Supervision of high risk pregnancy, unspecified, third trimester: Secondary | ICD-10-CM

## 2017-01-02 DIAGNOSIS — F32A Depression, unspecified: Secondary | ICD-10-CM

## 2017-01-02 DIAGNOSIS — F329 Major depressive disorder, single episode, unspecified: Secondary | ICD-10-CM

## 2017-01-02 NOTE — Progress Notes (Signed)
   PRENATAL VISIT NOTE  Subjective:  Candace Barnes is a 26 y.o. G1P0000 at [redacted]w[redacted]d being seen today for ongoing prenatal care.  She is currently monitored for the following issues for this high-risk pregnancy and has PITUITARY ADENOMA, BENIGN; Depression; Migraine; Seizure ([place]); Supervision of high risk pregnancy, antepartum; and Seizure disorder during pregnancy, antepartum ([place]) on her problem list.  Patient reports trouble getting comfortable at night..  Contractions: Not present. Vag. Bleeding: None.  Movement: Present. Denies leaking of fluid.   The following portions of the patient's history were reviewed and updated as appropriate: allergies, current medications, past family history, past medical history, past social history, past surgical history and problem list. Problem list updated.  Objective:   Vitals:   01/02/17 0907  BP: 116/70  Pulse: 85  Weight: 151 lb 14.4 oz (68.9 kg)    Fetal Status: Fetal Heart Rate (bpm): 145   Movement: Present     General:  Alert, oriented and cooperative. Patient is in no acute distress.  Skin: Skin is warm and dry. No rash noted.   Cardiovascular: Normal heart rate noted  Respiratory: Normal respiratory effort, no problems with respiration noted  Abdomen: Soft, gravid, appropriate for gestational age.  Pain/Pressure: Present     Pelvic: Cervical exam deferred        Extremities: Normal range of motion.  Edema: Trace  Mental Status:  Normal mood and affect. Normal behavior. Normal judgment and thought content.   Assessment and Plan:  Pregnancy: G1P0000 at [redacted]w[redacted]d  1. Supervision of high risk pregnancy, antepartum Needs HIV from health dept.  Requested again.  Not fasting for 2 hour GTT.  Will come tomorrow.  [employer] [place] tomorrow.  2. Seizure disorder during pregnancy, antepartum [facility]) Note from Dr. Delice Lesch is not complete from 12/30/16.  Pt states that EEG is nml but was instructed to take Keppra.  Pt will start today.  Pt also promises to make  appt with [facility] for psychogenic seizure activity.  3. Depression, unspecified depression type No issues with depression today  Preterm labor symptoms and general obstetric precautions including but not limited to vaginal bleeding, contractions, leaking of fluid and fetal movement were reviewed in detail with the patient. Please refer to After Visit Summary for other counseling recommendations.  Return in about 2 weeks (around 01/16/2017).   Silas [place], MD

## 2017-01-02 NOTE — Patient Instructions (Signed)

## 2017-01-03 ENCOUNTER — Ambulatory Visit (HOSPITAL_COMMUNITY)
Admission: RE | Admit: 2017-01-03 | Discharge: 2017-01-03 | Disposition: A | Discharge status: Home / Self Care | Payer: Medicaid Other | Source: Ambulatory Visit | Attending: Obstetrics & Gynecology | Admitting: Obstetrics & Gynecology

## 2017-01-03 ENCOUNTER — Encounter: Payer: Self-pay | Admitting: Neurology

## 2017-01-03 ENCOUNTER — Other Ambulatory Visit: Payer: Medicaid Other

## 2017-01-03 ENCOUNTER — Other Ambulatory Visit (HOSPITAL_COMMUNITY): Payer: Self-pay | Admitting: *Deleted

## 2017-01-03 DIAGNOSIS — G40909 Epilepsy, unspecified, not intractable, without status epilepticus: Secondary | ICD-10-CM

## 2017-01-03 DIAGNOSIS — Z362 Encounter for other antenatal screening follow-up: Secondary | ICD-10-CM | POA: Diagnosis not present

## 2017-01-03 DIAGNOSIS — O99353 Diseases of the nervous system complicating pregnancy, third trimester: Secondary | ICD-10-CM | POA: Insufficient documentation

## 2017-01-03 DIAGNOSIS — Z3A29 29 weeks gestation of pregnancy: Secondary | ICD-10-CM | POA: Diagnosis not present

## 2017-01-03 DIAGNOSIS — O0992 Supervision of high risk pregnancy, unspecified, second trimester: Secondary | ICD-10-CM

## 2017-01-04 LAB — CBC
HEMOGLOBIN: 10 g/dL — AB (ref 11.1–15.9)
Hematocrit: 31.4 % — ABNORMAL LOW (ref 34.0–46.6)
MCH: 22.2 pg — ABNORMAL LOW (ref 26.6–33.0)
MCHC: 31.8 g/dL (ref 31.5–35.7)
MCV: 70 fL — AB (ref 79–97)
Platelets: 211 10*3/uL (ref 150–379)
RBC: 4.51 x10E6/uL (ref 3.77–5.28)
RDW: 19.3 % — AB (ref 12.3–15.4)
WBC: 7.6 10*3/uL (ref 3.4–10.8)

## 2017-01-04 LAB — GLUCOSE TOLERANCE, 2 HOURS W/ 1HR
GLUCOSE, FASTING: 70 mg/dL (ref 65–91)
Glucose, 1 hour: 108 mg/dL (ref 65–179)
Glucose, 2 hour: 70 mg/dL (ref 65–152)

## 2017-01-04 LAB — RPR: RPR Ser Ql: NONREACTIVE

## 2017-01-04 LAB — HIV ANTIBODY (ROUTINE TESTING W REFLEX): HIV Screen 4th Generation wRfx: NONREACTIVE

## 2017-01-05 ENCOUNTER — Telehealth: Payer: Self-pay | Admitting: General Practice

## 2017-01-05 NOTE — Telephone Encounter (Signed)
Called patient, no answer- left message to call us back concerning non urgent results.

## 2017-01-05 NOTE — Telephone Encounter (Signed)
-----   Message from Candace Guild, NP sent at 01/05/2017 11:53 AM EDT ----- Patient passed her gtt.  Anemic. Can take OTC iron supplement daily.  Thanks

## 2017-01-06 NOTE — Telephone Encounter (Signed)
Called patient no answer left message for her to call us back regarding lab results and need to start IRON.

## 2017-01-16 ENCOUNTER — Encounter: Payer: Self-pay | Admitting: Obstetrics & Gynecology

## 2017-01-23 ENCOUNTER — Encounter: Payer: Self-pay | Admitting: General Practice

## 2017-01-23 NOTE — Telephone Encounter (Signed)
Called patient, no answer- left message to call us back concerning results & to reschedule your missed appt. Will send letter

## 2017-01-25 ENCOUNTER — Telehealth: Payer: Self-pay | Admitting: General Practice

## 2017-01-25 NOTE — Telephone Encounter (Signed)
Patient called and left message requesting test results. Patient states if she doesn't answer we can leave results on her voicemail. Called patient, no answer- left message stating we are calling to return your phone call. Your most recent blood work shows you are anemic. Please go to your pharmacy and pick up an iron supplement OTC to take daily with breakfast with a citrus juice. You may call us back  If you have questions or concerns

## 2017-02-14 ENCOUNTER — Encounter (HOSPITAL_COMMUNITY): Payer: Self-pay

## 2017-02-14 ENCOUNTER — Ambulatory Visit (HOSPITAL_COMMUNITY)
Admission: RE | Admit: 2017-02-14 | Discharge: 2017-02-14 | Disposition: A | Discharge status: Home / Self Care | Payer: Medicaid Other | Source: Ambulatory Visit | Attending: Obstetrics and Gynecology | Admitting: Obstetrics and Gynecology

## 2017-02-14 ENCOUNTER — Ambulatory Visit (HOSPITAL_COMMUNITY)
Admission: RE | Admit: 2017-02-14 | Discharge: 2017-02-14 | Disposition: A | Discharge status: Home / Self Care | Payer: Medicaid Other | Source: Ambulatory Visit | Attending: Obstetrics & Gynecology | Admitting: Obstetrics & Gynecology

## 2017-02-14 DIAGNOSIS — Z3A35 35 weeks gestation of pregnancy: Secondary | ICD-10-CM | POA: Diagnosis not present

## 2017-02-14 DIAGNOSIS — O99353 Diseases of the nervous system complicating pregnancy, third trimester: Secondary | ICD-10-CM | POA: Insufficient documentation

## 2017-02-14 DIAGNOSIS — G40909 Epilepsy, unspecified, not intractable, without status epilepticus: Secondary | ICD-10-CM | POA: Diagnosis not present

## 2017-03-07 ENCOUNTER — Encounter: Payer: Self-pay | Admitting: Obstetrics and Gynecology

## 2017-03-07 ENCOUNTER — Other Ambulatory Visit (HOSPITAL_COMMUNITY)
Admission: RE | Admit: 2017-03-07 | Discharge: 2017-03-07 | Disposition: A | Discharge status: Home / Self Care | Payer: Medicaid Other | Source: Ambulatory Visit | Attending: Obstetrics and Gynecology | Admitting: Obstetrics and Gynecology

## 2017-03-07 ENCOUNTER — Ambulatory Visit (INDEPENDENT_AMBULATORY_CARE_PROVIDER_SITE_OTHER): Payer: Medicaid Other | Admitting: Obstetrics and Gynecology

## 2017-03-07 VITALS — BP 124/75 | HR 67 | Wt 157.5 lb

## 2017-03-07 DIAGNOSIS — O99350 Diseases of the nervous system complicating pregnancy, unspecified trimester: Secondary | ICD-10-CM

## 2017-03-07 DIAGNOSIS — O99353 Diseases of the nervous system complicating pregnancy, third trimester: Secondary | ICD-10-CM | POA: Diagnosis not present

## 2017-03-07 DIAGNOSIS — Z331 Pregnant state, incidental: Secondary | ICD-10-CM

## 2017-03-07 DIAGNOSIS — O0990 Supervision of high risk pregnancy, unspecified, unspecified trimester: Secondary | ICD-10-CM | POA: Insufficient documentation

## 2017-03-07 DIAGNOSIS — Z113 Encounter for screening for infections with a predominantly sexual mode of transmission: Secondary | ICD-10-CM

## 2017-03-07 DIAGNOSIS — G40909 Epilepsy, unspecified, not intractable, without status epilepticus: Secondary | ICD-10-CM | POA: Diagnosis not present

## 2017-03-07 DIAGNOSIS — O0930 Supervision of pregnancy with insufficient antenatal care, unspecified trimester: Secondary | ICD-10-CM | POA: Insufficient documentation

## 2017-03-07 DIAGNOSIS — O0933 Supervision of pregnancy with insufficient antenatal care, third trimester: Secondary | ICD-10-CM

## 2017-03-07 DIAGNOSIS — O0993 Supervision of high risk pregnancy, unspecified, third trimester: Secondary | ICD-10-CM

## 2017-03-07 DIAGNOSIS — Z23 Encounter for immunization: Secondary | ICD-10-CM | POA: Diagnosis not present

## 2017-03-07 DIAGNOSIS — Z3A00 Weeks of gestation of pregnancy not specified: Secondary | ICD-10-CM | POA: Diagnosis not present

## 2017-03-07 NOTE — Progress Notes (Signed)
Prenatal Visit Note Date: 03/07/2017 Clinic: [facility]  Subjective:  Candace Barnes is a 26 y.o. G1P0000 at [redacted]w[redacted]d being seen today for ongoing prenatal care.  She is currently monitored for the following issues for this high-risk pregnancy and has PITUITARY ADENOMA, BENIGN; Depression; Migraine; Seizure ([place]); Supervision of high risk pregnancy, antepartum; Seizure disorder during pregnancy, antepartum ([place]); and Insufficient prenatal care on her problem list.  Patient reports no complaints.   Contractions: Irregular. Vag. Bleeding: None.  Movement: Present. Denies leaking of fluid.   The following portions of the patient's history were reviewed and updated as appropriate: allergies, current medications, past family history, past medical history, past social history, past surgical history and problem list. Problem list updated.  Objective:   Vitals:   03/07/17 1528  BP: 124/75  Pulse: 67  Weight: 157 lb 8 oz (71.4 kg)    Fetal Status: Fetal Heart Rate (bpm): 156 Fundal Height: 38 cm Movement: Present  Presentation: Vertex  General:  Alert, oriented and cooperative. Patient is in no acute distress.  Skin: Skin is warm and dry. No rash noted.   Cardiovascular: Normal heart rate noted  Respiratory: Normal respiratory effort, no problems with respiration noted  Abdomen: Soft, gravid, appropriate for gestational age. Pain/Pressure: Present     Pelvic:  Cervical exam deferred        Extremities: Normal range of motion.  Edema: None  Mental Status: Normal mood and affect. Normal behavior. Normal judgment and thought content.   Urinalysis:      Assessment and Plan:  Pregnancy: G1P0000 at [redacted]w[redacted]d  1. Supervision of high risk pregnancy, antepartum Routine care. D/w pt re: BC nv - Strep Gp B NAA - Cervicovaginal ancillary only  2. Seizure disorder during pregnancy, antepartum ([place]) Pt never started the keppra. Unsure of when last seizure was but she states she didn't  have any in september  3. Insufficient prenatal care in third trimester  4. Need for immunization against influenza - Flu Vaccine QUAD 6+ mos IM (Fluarix)  Term labor symptoms and general obstetric precautions including but not limited to vaginal bleeding, contractions, leaking of fluid and fetal movement were reviewed in detail with the patient. Please refer to After Visit Summary for other counseling recommendations.  Return in about 1 week (around 03/14/2017) for rob.   Aletha Halim, MD

## 2017-03-08 LAB — CERVICOVAGINAL ANCILLARY ONLY
Chlamydia: NEGATIVE
NEISSERIA GONORRHEA: NEGATIVE
Trichomonas: NEGATIVE

## 2017-03-09 LAB — STREP GP B NAA: Strep Gp B NAA: NEGATIVE

## 2017-03-15 ENCOUNTER — Encounter (HOSPITAL_COMMUNITY): Payer: Self-pay | Admitting: *Deleted

## 2017-03-15 ENCOUNTER — Inpatient Hospital Stay (HOSPITAL_COMMUNITY)
Admission: AD | Admit: 2017-03-15 | Discharge: 2017-03-15 | Disposition: A | Discharge status: Home / Self Care | Payer: Medicaid Other | Source: Ambulatory Visit | Attending: Family Medicine | Admitting: Family Medicine

## 2017-03-15 ENCOUNTER — Inpatient Hospital Stay (HOSPITAL_COMMUNITY): Payer: Medicaid Other

## 2017-03-15 DIAGNOSIS — Z3A39 39 weeks gestation of pregnancy: Secondary | ICD-10-CM

## 2017-03-15 NOTE — MAU Note (Signed)
I have communicated with Dr. Manus Rudd and reviewed vital signs:  Vitals:   03/15/17 1702 03/15/17 2034  BP: 111/73 114/76  Pulse: 81 65  Resp: 17 18  Temp: 99.6 F (37.6 C) 98.9 F (37.2 C)  SpO2:      Vaginal exam:  Dilation: 2 Effacement (%): 70 Cervical Position: Middle Station: -1 Presentation: Vertex Exam by:: Moss, DO,   Also reviewed contraction pattern and that non-stress test is reactive.  It has been documented that patient is contracting every 2-4 minutes with no cervical change over 7 hours not indicating active labor.  Patient denies any other complaints.  Based on this report provider has given and after reviewing BPP/ultrasound, order for discharge.  A discharge order and diagnosis entered by a provider.   Labor discharge instructions reviewed with patient.

## 2017-03-15 NOTE — MAU Note (Signed)
Pt. Removed from fetal monitoring, per provider, pt. may ambulate for one hour, recheck SVE for cervical advancement.

## 2017-03-15 NOTE — MAU Note (Signed)
+  contractions 54mins apart Rating pain 8/10  + increasing vaginal pressure  Denies LOF or Vaginal bleeding  +FM  States no recent VE

## 2017-03-16 ENCOUNTER — Inpatient Hospital Stay (HOSPITAL_COMMUNITY): Payer: Medicaid Other | Admitting: Anesthesiology

## 2017-03-16 ENCOUNTER — Inpatient Hospital Stay (HOSPITAL_COMMUNITY)
Admission: AD | Admit: 2017-03-16 | Discharge: 2017-03-18 | DRG: 807 | Disposition: A | Discharge status: Home / Self Care | Payer: Medicaid Other | Source: Ambulatory Visit | Attending: Family Medicine | Admitting: Family Medicine

## 2017-03-16 ENCOUNTER — Encounter (HOSPITAL_COMMUNITY): Payer: Self-pay | Admitting: *Deleted

## 2017-03-16 DIAGNOSIS — O99350 Diseases of the nervous system complicating pregnancy, unspecified trimester: Secondary | ICD-10-CM

## 2017-03-16 DIAGNOSIS — G40909 Epilepsy, unspecified, not intractable, without status epilepticus: Secondary | ICD-10-CM | POA: Diagnosis present

## 2017-03-16 DIAGNOSIS — O99354 Diseases of the nervous system complicating childbirth: Secondary | ICD-10-CM | POA: Diagnosis present

## 2017-03-16 DIAGNOSIS — D352 Benign neoplasm of pituitary gland: Secondary | ICD-10-CM

## 2017-03-16 DIAGNOSIS — F329 Major depressive disorder, single episode, unspecified: Secondary | ICD-10-CM | POA: Diagnosis present

## 2017-03-16 DIAGNOSIS — F32A Depression, unspecified: Secondary | ICD-10-CM

## 2017-03-16 DIAGNOSIS — G43019 Migraine without aura, intractable, without status migrainosus: Secondary | ICD-10-CM

## 2017-03-16 DIAGNOSIS — O99344 Other mental disorders complicating childbirth: Secondary | ICD-10-CM | POA: Diagnosis present

## 2017-03-16 DIAGNOSIS — Z87891 Personal history of nicotine dependence: Secondary | ICD-10-CM

## 2017-03-16 DIAGNOSIS — R569 Unspecified convulsions: Secondary | ICD-10-CM

## 2017-03-16 DIAGNOSIS — D353 Benign neoplasm of craniopharyngeal duct: Secondary | ICD-10-CM

## 2017-03-16 DIAGNOSIS — Z3A39 39 weeks gestation of pregnancy: Secondary | ICD-10-CM

## 2017-03-16 DIAGNOSIS — O26893 Other specified pregnancy related conditions, third trimester: Secondary | ICD-10-CM | POA: Diagnosis present

## 2017-03-16 DIAGNOSIS — O0933 Supervision of pregnancy with insufficient antenatal care, third trimester: Secondary | ICD-10-CM

## 2017-03-16 DIAGNOSIS — O0990 Supervision of high risk pregnancy, unspecified, unspecified trimester: Secondary | ICD-10-CM

## 2017-03-16 LAB — CBC
HEMATOCRIT: 35.4 % — AB (ref 36.0–46.0)
Hemoglobin: 11.7 g/dL — ABNORMAL LOW (ref 12.0–15.0)
MCH: 22.5 pg — AB (ref 26.0–34.0)
MCHC: 33.1 g/dL (ref 30.0–36.0)
MCV: 68.2 fL — AB (ref 78.0–100.0)
Platelets: 217 10*3/uL (ref 150–400)
RBC: 5.19 MIL/uL — AB (ref 3.87–5.11)
RDW: 18.5 % — ABNORMAL HIGH (ref 11.5–15.5)
WBC: 11.5 10*3/uL — ABNORMAL HIGH (ref 4.0–10.5)

## 2017-03-16 LAB — TYPE AND SCREEN
ABO/RH(D): O POS
Antibody Screen: NEGATIVE

## 2017-03-16 LAB — ABO/RH: ABO/RH(D): O POS

## 2017-03-16 MED ORDER — OXYTOCIN BOLUS FROM INFUSION
500.0000 mL | Freq: Once | INTRAVENOUS | Status: AC
  Administered 2017-03-16: 500 mL via INTRAVENOUS

## 2017-03-16 MED ORDER — PHENYLEPHRINE 40 MCG/ML (10ML) SYRINGE FOR IV PUSH (FOR BLOOD PRESSURE SUPPORT)
PREFILLED_SYRINGE | INTRAVENOUS | Status: AC
  Filled 2017-03-16: qty 10

## 2017-03-16 MED ORDER — EPHEDRINE 5 MG/ML INJ
10.0000 mg | INTRAVENOUS | Status: DC | PRN
  Filled 2017-03-16: qty 2

## 2017-03-16 MED ORDER — FENTANYL CITRATE (PF) 100 MCG/2ML IJ SOLN: 100.0000 ug | Freq: Once | INTRAMUSCULAR | Status: DC

## 2017-03-16 MED ORDER — OXYCODONE-ACETAMINOPHEN 5-325 MG PO TABS: 2.0000 | ORAL_TABLET | ORAL | Status: DC | PRN

## 2017-03-16 MED ORDER — SOD CITRATE-CITRIC ACID 500-334 MG/5ML PO SOLN
30.0000 mL | ORAL | Status: DC | PRN
Start: 2017-03-16 — End: 2017-03-18

## 2017-03-16 MED ORDER — FENTANYL 2.5 MCG/ML BUPIVACAINE 1/10 % EPIDURAL INFUSION (WH - ANES)
14.0000 mL/h | INTRAMUSCULAR | Status: DC | PRN
  Administered 2017-03-16: 14 mL/h via EPIDURAL

## 2017-03-16 MED ORDER — OXYCODONE-ACETAMINOPHEN 5-325 MG PO TABS
1.0000 | ORAL_TABLET | ORAL | Status: DC | PRN
  Filled 2017-03-16: qty 1

## 2017-03-16 MED ORDER — MISOPROSTOL 200 MCG PO TABS
ORAL_TABLET | ORAL | Status: AC
  Administered 2017-03-16: 600 ug via RECTAL
  Filled 2017-03-16: qty 3

## 2017-03-16 MED ORDER — LACTATED RINGERS IV SOLN: 500.0000 mL | Freq: Once | INTRAVENOUS | Status: DC

## 2017-03-16 MED ORDER — FENTANYL 2.5 MCG/ML BUPIVACAINE 1/10 % EPIDURAL INFUSION (WH - ANES)
INTRAMUSCULAR | Status: AC
  Filled 2017-03-16: qty 100

## 2017-03-16 MED ORDER — LACTATED RINGERS IV SOLN
500.0000 mL | INTRAVENOUS | Status: DC | PRN
  Administered 2017-03-16: 500 mL via INTRAVENOUS

## 2017-03-16 MED ORDER — DEXTROSE 5 % IV SOLN
2.0000 g | Freq: Once | INTRAVENOUS | Status: AC
  Administered 2017-03-17: 2 g via INTRAVENOUS
  Filled 2017-03-16: qty 2

## 2017-03-16 MED ORDER — FENTANYL CITRATE (PF) 100 MCG/2ML IJ SOLN
50.0000 ug | INTRAMUSCULAR | Status: DC | PRN
  Administered 2017-03-16 (×3): 100 ug via INTRAVENOUS
  Filled 2017-03-16 (×3): qty 2

## 2017-03-16 MED ORDER — LIDOCAINE HCL (PF) 1 % IJ SOLN
30.0000 mL | INTRAMUSCULAR | Status: DC | PRN
  Administered 2017-03-16: 30 mL via SUBCUTANEOUS
  Filled 2017-03-16 (×2): qty 30

## 2017-03-16 MED ORDER — FENTANYL CITRATE (PF) 100 MCG/2ML IJ SOLN: 100.0000 ug | INTRAMUSCULAR | Status: DC | PRN

## 2017-03-16 MED ORDER — PHENYLEPHRINE 40 MCG/ML (10ML) SYRINGE FOR IV PUSH (FOR BLOOD PRESSURE SUPPORT)
80.0000 ug | PREFILLED_SYRINGE | INTRAVENOUS | Status: DC | PRN
  Filled 2017-03-16: qty 5

## 2017-03-16 MED ORDER — LIDOCAINE HCL (PF) 1 % IJ SOLN
INTRAMUSCULAR | Status: DC | PRN
  Administered 2017-03-16 (×2): 7 mL via EPIDURAL

## 2017-03-16 MED ORDER — FENTANYL CITRATE (PF) 100 MCG/2ML IJ SOLN: 50.0000 ug | Freq: Once | INTRAMUSCULAR | Status: DC

## 2017-03-16 MED ORDER — METHYLERGONOVINE MALEATE 0.2 MG/ML IJ SOLN
0.2000 mg | Freq: Once | INTRAMUSCULAR | Status: AC
  Administered 2017-03-16: 0.2 mg via INTRAMUSCULAR

## 2017-03-16 MED ORDER — ACETAMINOPHEN 325 MG PO TABS: 650.0000 mg | ORAL_TABLET | ORAL | Status: DC | PRN

## 2017-03-16 MED ORDER — LACTATED RINGERS IV SOLN
INTRAVENOUS | Status: DC
  Administered 2017-03-16: 12:00:00 via INTRAVENOUS

## 2017-03-16 MED ORDER — ONDANSETRON HCL 4 MG/2ML IJ SOLN: 4.0000 mg | Freq: Four times a day (QID) | INTRAMUSCULAR | Status: DC | PRN

## 2017-03-16 MED ORDER — DIPHENHYDRAMINE HCL 50 MG/ML IJ SOLN: 12.5000 mg | INTRAMUSCULAR | Status: DC | PRN

## 2017-03-16 MED ORDER — OXYTOCIN 40 UNITS IN LACTATED RINGERS INFUSION - SIMPLE MED
2.5000 [IU]/h | INTRAVENOUS | Status: DC
  Filled 2017-03-16: qty 1000

## 2017-03-16 NOTE — Progress Notes (Signed)
AROM, mod mec. Cx 9/90/+1.  FHR Cat 1. Comfortable w/epidural.  Anticipate SVD soon.

## 2017-03-16 NOTE — Anesthesia Preprocedure Evaluation (Signed)
Anesthesia Evaluation  Patient identified by MRN, date of birth, ID band Patient awake    Reviewed: Allergy & Precautions, H&P , NPO status , Patient's Chart, lab work & pertinent test results  Airway Mallampati: I  TM Distance: >3 FB Neck ROM: full    Dental no notable dental hx. (+) Teeth Intact   Pulmonary former smoker,    Pulmonary exam normal breath sounds clear to auscultation       Cardiovascular negative cardio ROS Normal cardiovascular exam Rhythm:regular Rate:Normal     Neuro/Psych    GI/Hepatic negative GI ROS, Neg liver ROS,   Endo/Other  negative endocrine ROS  Renal/GU negative Renal ROS     Musculoskeletal negative musculoskeletal ROS (+)   Abdominal Normal abdominal exam  (+)   Peds  Hematology negative hematology ROS (+)   Anesthesia Other Findings   Reproductive/Obstetrics (+) Pregnancy                             Anesthesia Physical Anesthesia Plan  ASA: II  Anesthesia Plan: Epidural   Post-op Pain Management:    Induction:   PONV Risk Score and Plan:   Airway Management Planned:   Additional Equipment:   Intra-op Plan:   Post-operative Plan:   Informed Consent: I have reviewed the patients History and Physical, chart, labs and discussed the procedure including the risks, benefits and alternatives for the proposed anesthesia with the patient or authorized representative who has indicated his/her understanding and acceptance.     Plan Discussed with:   Anesthesia Plan Comments:         Anesthesia Quick Evaluation

## 2017-03-16 NOTE — Anesthesia Procedure Notes (Signed)
Epidural Patient location during procedure: OB Start time: 03/16/2017 6:41 PM End time: 03/16/2017 6:45 PM  Staffing Anesthesiologist: Lyn Hollingshead Performed: anesthesiologist   Preanesthetic Checklist Completed: patient identified, surgical consent, pre-op evaluation, timeout performed, IV checked, risks and benefits discussed and monitors and equipment checked  Epidural Patient position: sitting Prep: site prepped and draped and DuraPrep Patient monitoring: continuous pulse ox and blood pressure Approach: midline Location: L3-L4 Injection technique: LOR air  Needle:  Needle type: Tuohy  Needle gauge: 17 G Needle length: 9 cm and 9 Needle insertion depth: 5 cm cm Catheter type: closed end flexible Catheter size: 19 Gauge Catheter at skin depth: 10 cm Test dose: negative and Other  Assessment Sensory level: T10 Events: blood not aspirated, injection not painful, no injection resistance, negative IV test and no paresthesia
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
## 2017-03-16 NOTE — Anesthesia Pain Management Evaluation Note (Signed)
  CRNA Pain Management Visit Note  Patient: Candace Barnes, 26 y.o., female  "Hello I am a member of the anesthesia team at [facility]. We have an anesthesia team available at all times to provide care throughout the hospital, including epidural management and anesthesia for C-section. I don't know your plan for the delivery whether it a natural birth, water birth, IV sedation, nitrous supplementation, doula or epidural, but we want to meet your pain goals."   1.Was your pain managed to your expectations on prior hospitalizations?   No prior hospitalizations  2.What is your expectation for pain management during this hospitalization?     Epidural and IV pain meds  3.How can we help you reach that goal? IV pain meds then epidural  Record the patient's initial score and the patient's pain goal.   Pain: 2  Pain Goal: 4 [facility] wants you to be able to say your pain was always managed very well.  Harlei Lehrmann 03/16/2017

## 2017-03-16 NOTE — MAU Note (Signed)
Pt stated having ctx since yesterday. Came yesterday and was 2 cm. Reports loosing mucus plug. Good fetal movement reported

## 2017-03-17 ENCOUNTER — Encounter (HOSPITAL_COMMUNITY): Payer: Self-pay

## 2017-03-17 LAB — CBC
HCT: 25.5 % — ABNORMAL LOW (ref 36.0–46.0)
HCT: 29.5 % — ABNORMAL LOW (ref 36.0–46.0)
HEMOGLOBIN: 9.8 g/dL — AB (ref 12.0–15.0)
Hemoglobin: 8.6 g/dL — ABNORMAL LOW (ref 12.0–15.0)
MCH: 22.8 pg — ABNORMAL LOW (ref 26.0–34.0)
MCH: 23 pg — AB (ref 26.0–34.0)
MCHC: 33.2 g/dL (ref 30.0–36.0)
MCHC: 33.7 g/dL (ref 30.0–36.0)
MCV: 68.2 fL — AB (ref 78.0–100.0)
MCV: 68.8 fL — ABNORMAL LOW (ref 78.0–100.0)
PLATELETS: 191 10*3/uL (ref 150–400)
Platelets: 210 10*3/uL (ref 150–400)
RBC: 3.74 MIL/uL — ABNORMAL LOW (ref 3.87–5.11)
RBC: 4.29 MIL/uL (ref 3.87–5.11)
RDW: 18.5 % — AB (ref 11.5–15.5)
RDW: 18.6 % — AB (ref 11.5–15.5)
WBC: 16.9 10*3/uL — ABNORMAL HIGH (ref 4.0–10.5)
WBC: 21.1 10*3/uL — ABNORMAL HIGH (ref 4.0–10.5)

## 2017-03-17 LAB — RPR: RPR Ser Ql: NONREACTIVE

## 2017-03-17 MED ORDER — TETANUS-DIPHTH-ACELL PERTUSSIS 5-2.5-18.5 LF-MCG/0.5 IM SUSP: 0.5000 mL | Freq: Once | INTRAMUSCULAR | Status: DC

## 2017-03-17 MED ORDER — COCONUT OIL OIL: 1.0000 "application " | TOPICAL_OIL | Status: DC | PRN

## 2017-03-17 MED ORDER — ACETAMINOPHEN 325 MG PO TABS: 650.0000 mg | ORAL_TABLET | ORAL | Status: DC | PRN

## 2017-03-17 MED ORDER — SENNOSIDES-DOCUSATE SODIUM 8.6-50 MG PO TABS
2.0000 | ORAL_TABLET | ORAL | Status: DC
  Administered 2017-03-18: 2 via ORAL
  Filled 2017-03-17: qty 2

## 2017-03-17 MED ORDER — ONDANSETRON HCL 4 MG PO TABS: 4.0000 mg | ORAL_TABLET | ORAL | Status: DC | PRN

## 2017-03-17 MED ORDER — OXYCODONE HCL 5 MG PO TABS: 10.0000 mg | ORAL_TABLET | Freq: Four times a day (QID) | ORAL | Status: DC | PRN

## 2017-03-17 MED ORDER — BENZOCAINE-MENTHOL 20-0.5 % EX AERO
1.0000 "application " | INHALATION_SPRAY | CUTANEOUS | Status: DC | PRN
  Administered 2017-03-17: 1 via TOPICAL
  Filled 2017-03-17: qty 56

## 2017-03-17 MED ORDER — PRENATAL MULTIVITAMIN CH
1.0000 | ORAL_TABLET | Freq: Every day | ORAL | Status: DC
  Administered 2017-03-17 – 2017-03-18 (×2): 1 via ORAL
  Filled 2017-03-17 (×2): qty 1

## 2017-03-17 MED ORDER — WITCH HAZEL-GLYCERIN EX PADS: 1.0000 "application " | MEDICATED_PAD | CUTANEOUS | Status: DC | PRN

## 2017-03-17 MED ORDER — SIMETHICONE 80 MG PO CHEW: 80.0000 mg | CHEWABLE_TABLET | ORAL | Status: DC | PRN

## 2017-03-17 MED ORDER — DIPHENHYDRAMINE HCL 25 MG PO CAPS: 25.0000 mg | ORAL_CAPSULE | Freq: Four times a day (QID) | ORAL | Status: DC | PRN

## 2017-03-17 MED ORDER — ZOLPIDEM TARTRATE 5 MG PO TABS: 5.0000 mg | ORAL_TABLET | Freq: Every evening | ORAL | Status: DC | PRN

## 2017-03-17 MED ORDER — FERROUS FUMARATE 324 (106 FE) MG PO TABS
1.0000 | ORAL_TABLET | Freq: Two times a day (BID) | ORAL | Status: DC
  Administered 2017-03-17 – 2017-03-18 (×3): 106 mg via ORAL
  Filled 2017-03-17 (×6): qty 1

## 2017-03-17 MED ORDER — IBUPROFEN 600 MG PO TABS
600.0000 mg | ORAL_TABLET | Freq: Four times a day (QID) | ORAL | Status: DC
  Administered 2017-03-17 – 2017-03-18 (×4): 600 mg via ORAL
  Filled 2017-03-17 (×5): qty 1

## 2017-03-17 MED ORDER — ONDANSETRON HCL 4 MG/2ML IJ SOLN: 4.0000 mg | INTRAMUSCULAR | Status: DC | PRN

## 2017-03-17 MED ORDER — SODIUM CHLORIDE 0.9 % IV BOLUS (SEPSIS)
1000.0000 mL | Freq: Once | INTRAVENOUS | Status: AC
  Administered 2017-03-17: 1000 mL via INTRAVENOUS

## 2017-03-17 MED ORDER — DIBUCAINE 1 % RE OINT: 1.0000 "application " | TOPICAL_OINTMENT | RECTAL | Status: DC | PRN

## 2017-03-17 NOTE — Clinical Social Work Maternal (Signed)
CLINICAL SOCIAL WORK MATERNAL/CHILD NOTE  Patient Details  Name: Candace Barnes MRN: [id number] Date of Birth: 16-Aug-1990  Date:  03/17/2017  Clinical Social Worker Initiating Note:  Ferdinand Lango Naomee Nowland, MSW, LCSW-A Date/Time: Initiated:  03/17/17/1203     Child's Name:  Candace Barnes    Biological Parents:  Mother   Need for Interpreter:  None   Reason for Referral:  Other (Comment) (hx of depression and limited [facility])   Address:  [place] [address]    Phone number:  [phone] (home)     Additional phone number: n/a  Household Members/Support Persons (HM/SP):   Household Member/Support Person 1   HM/SP Name Relationship DOB or Age  HM/SP -[place] MOB's father  unknown  HM/SP -2        HM/SP -3        HM/SP -4        HM/SP -5        HM/SP -6        HM/SP -7        HM/SP -8          Natural Supports (not living in the home):  Friends, Extended Family, Spouse/significant other   Professional Supports: None   Employment: Unemployed   Type of Work: MOB unemployed    Education:  Programmer, systems   Homebound arranged: No  Financial Resources:  [employer]   Other Resources:  Physicist, medical , [place] Considerations Which May Impact Care:  None reported   Strengths:  Ability to meet basic needs , Compliance with medical plan , Home prepared for child , Pediatrician chosen   Psychotropic Medications:         Pediatrician:    [place] area  Pediatrician List:   [facility] [place]      Pediatrician Fax Number:    Risk Factors/Current Problems:  None   Cognitive State:  Alert , Able to Concentrate , Insightful , Goal Oriented    Mood/Affect:  Calm , Comfortable , Interested    CSW Assessment: CSW met with MOB at bedside to complete assessment for consult regarding hx of depression and  limited PNC. Upon this writers arrival, MOB was accompanied by a visitor. With MOB's permission, this writer explained role and reasoning for visit. MOB was warm and welcoming. This Probation officer inquired about hx of depression. MOB confirmed her depressive hx and notes she was dx in 2011. MOB further notes she has not experienced symptoms in years. CSW discussed and educated MOB on PPD. CSW encouraged MOB to check in with herself throughout the post-partum period and if she feels depressive symptoms onset, to seek medical attention. MOB noted understanding.   CSW inquired about any barriers to following up for she and baby's medical appointments. MOB noted there are none and she plans to take baby to [facility]. CSW discussed MOB's lapse in [facility] during 28 weeks and 38weeks. MOB noted she had financial stressors but noted they have now been resolved. CSW encouraged MOB to always use the health department in the event she is having financial strain in the future. MOB noted understanding. At this time, no other needs were addressed or requested. CSW thanked MOB for her time.   CSW Plan/Description:  Perinatal Mood and Anxiety Disorder (PMADs) Education, No Further Intervention Required/No  Barriers to Discharge, Other Information/Referral to [employer], MSW, [facility]  Office: [phone]

## 2017-03-17 NOTE — Anesthesia Postprocedure Evaluation (Signed)
Anesthesia Post Note  Patient: Candace Barnes  Procedure(s) Performed: AN AD [place]     Patient location during evaluation: Mother Baby Anesthesia Type: Epidural Level of consciousness: awake and alert Pain management: pain level controlled Vital Signs Assessment: post-procedure vital signs reviewed and stable Respiratory status: spontaneous breathing, nonlabored ventilation and respiratory function stable Cardiovascular status: stable Postop Assessment: no headache, no backache, epidural receding and patient able to bend at knees Anesthetic complications: no    Last Vitals:  Vitals:   03/17/17 0210 03/17/17 0310  BP: 123/74 111/76  Pulse: 74 76  Resp: 18 18  Temp: 37.4 C 37.3 C  SpO2: 100% 100%    Last Pain:  Vitals:   03/17/17 0555  TempSrc:   PainSc: 5    Pain Goal: Patients Stated Pain Goal: 5 (03/16/17 1501)               Rayvon Char

## 2017-03-17 NOTE — Lactation Note (Signed)
This note was copied from a baby's chart. Lactation Consultation Note  Patient Name: Candace Barnes Today's Date: 03/17/2017 Reason for consult: Initial assessment  Visited with Mom, baby 61 hrs old.  Mom had initially stated she wanted to pump and bottle feed.  Offered to set up DEBP and assist with pumping.  Mom has decided to exclusively formula feed.  Lactation brochure given, Mom states she would like to keep it.   Talked about her milk coming to volume, and what to expect.  Mom to ask for help prn.   Consult Status Consult Status: Complete    Candace Barnes 03/17/2017, 1:34 PM

## 2017-03-17 NOTE — Progress Notes (Addendum)
Patient ID: Candace Barnes, female   DOB: 05-05-1991, 26 y.o.   MRN: [id number] Called to see patient for bleeding. Patient with retained placenta. Bimanual reveals few small pieces of membranes. Her epidural was working and she was given IV Fentanyl for pain control. She had Cytotec with delivery. Methergine given. Continued bleeding noted. Foley placed. Banjo Curette passed x 3 with small amount of tissue removed but gritty texture noted in all quadrants. Uterus firm. Bleeding noted from bilateral labial and peri-urethral tears. Infiltrated with Lidocaine and repaired with 2-0 vicryl x 2 running and one figure of eight. Total EBL 1.7 liters. Postpartum hemorrhage protocol initiated and CBC drawn. Bleeding subsided. Mefoxin 2 gms given for prophylaxis. Uterus still firm and minimal bleeding noted. Vitals remained stable.

## 2017-03-17 NOTE — Progress Notes (Signed)
POSTPARTUM PROGRESS NOTE  Post Partum Day 1 Subjective:  Candace Barnes is a 26 y.o. G1P1001 [redacted]w[redacted]d s/p SVD with manual extraction of placenta.  No acute events overnight.  Pt reports problems with ambulating, voiding or po intake.  She denies nausea or vomiting.  Pain is moderately controlled.  She has not had flatus. She has not had bowel movement.  Lochia Minimal.   Objective: Blood pressure 111/76, pulse 76, temperature 99.1 F (37.3 C), temperature source Axillary, resp. rate 18, height 6' (1.829 m), weight 73 kg (161 lb), last menstrual period 06/20/2016, SpO2 100 %, unknown if currently breastfeeding.  Physical Exam:  General: alert, cooperative and no distress Lochia:normal flow Chest: CTAB Heart: RRR no m/r/g Abdomen: +BS, soft, nontender,  Uterine Fundus: firm DVT Evaluation: No calf swelling or tenderness Extremities: no edema   Recent Labs  03/16/17 2354 03/17/17 0504  HGB 9.8* 8.6*  HCT 29.5* 25.5*    Assessment/Plan:  ASSESSMENT: Candace Barnes is a 26 y.o. G1P1001 [redacted]w[redacted]d s/p SVD with manual extraction of placenta. Still not ambulating and has foley in place. UOP 850 in past 24hours. Will give 1L bolus and remove foley. Encourage ambulation with assistance today and for patient to improve PO intake.   Plan for discharge tomorrow   LOS: 1 day   Eloise Levels, MD PGY-2 Center for [facility], [facility]  03/17/2017, 9:33 AM

## 2017-03-18 MED ORDER — IBUPROFEN 600 MG PO TABS: 600.0000 mg | ORAL_TABLET | Freq: Four times a day (QID) | ORAL | 0 refills | Status: DC | PRN

## 2017-03-18 MED ORDER — MEASLES, MUMPS & RUBELLA VAC ~~LOC~~ INJ
0.5000 mL | INJECTION | Freq: Once | SUBCUTANEOUS | Status: AC
  Administered 2017-03-18: 0.5 mL via SUBCUTANEOUS
  Filled 2017-03-18: qty 0.5

## 2017-03-18 MED ORDER — FERROUS FUMARATE 324 (106 FE) MG PO TABS: 1.0000 | ORAL_TABLET | Freq: Two times a day (BID) | ORAL | 3 refills | Status: DC

## 2017-03-18 NOTE — Discharge Instructions (Signed)

## 2017-03-18 NOTE — Discharge Summary (Signed)
OB Discharge Summary     Patient Name: Candace Barnes DOB: 02-Apr-1991 MRN: [id number]  Date of admission: 03/16/2017 Delivering MD: Christin Fudge   Date of discharge: 03/18/2017  Admitting diagnosis: 39WKS,CTX Intrauterine pregnancy: [redacted]w[redacted]d    Secondary diagnosis:  Active Problems:   Normal labor   Postpartum hemorrhage  Additional problems: Rubella nonimmune; no PNC from 28-38wks; pituitary adenoma; seizure d/o; depression     Discharge diagnosis: Term Pregnancy Delivered                                                                                                Post partum procedures:none  Augmentation: AROM  Complications: None  Hospital course:  Onset of Labor With Vaginal Delivery     26y.o. yo G1P1001 at 353w2das admitted in Latent Labor on 03/16/2017. Patient had an uncomplicated labor course as follows:  Membrane Rupture Time/Date: 7:46 PM ,03/16/2017   Intrapartum Procedures: Episiotomy: None [1]                                         Lacerations:  Labial [10]  Patient had a delivery of a Viable infant. 03/16/2017  Information for the patient's newborn:  Candace Barnes, Candace Barnes [[id number]]     Pateint had an uncomplicated postpartum course.  She is ambulating, tolerating a regular diet, passing flatus, and urinating well. Patient is discharged home in stable condition on 03/18/17. She will be offered MMR prior to d/c.   Physical exam  Vitals:   03/17/17 0310 03/17/17 0800 03/17/17 1515 03/18/17 0700  BP: 111/76 108/65 114/75 109/90  Pulse: 76 72 (!) 106 (!) 104  Resp: 18 16 18 16   Temp: 99.1 F (37.3 C) 98.1 F (36.7 C) 99 F (37.2 C) 97.9 F (36.6 C)  TempSrc: Axillary Oral Oral   SpO2: 100% 100%    Weight:      Height:       General: alert and cooperative Lochia: appropriate Uterine Fundus: firm Incision: N/A DVT Evaluation: No evidence of DVT seen on physical exam. Labs: Lab Results  Component Value Date   WBC 21.1 (H)  03/17/2017   HGB 8.6 (L) 03/17/2017   HCT 25.5 (L) 03/17/2017   MCV 68.2 (L) 03/17/2017   PLT 191 03/17/2017   CMP Latest Ref Rng & Units 10/21/2016  Glucose 65 - 99 mg/dL 80  BUN 6 - 20 mg/dL <3(L)  Creatinine 0.44 - 1.00 mg/dL 0.50  Sodium 135 - 145 mmol/L 136  Potassium 3.5 - 5.1 mmol/L 4.2  Chloride 101 - 111 mmol/L 101  CO2 22 - 32 mmol/L -  Calcium 8.9 - 10.3 mg/dL -  Total Protein 6.5 - 8.1 g/dL -  Total Bilirubin 0.3 - 1.2 mg/dL -  Alkaline Phos 38 - 126 U/L -  AST 15 - 41 U/L -  ALT 14 - 54 U/L -    Discharge instruction: per After Visit Summary and "Baby and Me Booklet".  After visit meds:  Allergies  as of 03/18/2017   No Known Allergies     Medication List    TAKE these medications   Ferrous Fumarate 324 (106 Fe) MG Tabs tablet Commonly known as:  HEMOCYTE - 106 mg FE Take 1 tablet (106 mg of iron total) by mouth 2 (two) times daily.   ibuprofen 600 MG tablet Commonly known as:  ADVIL,MOTRIN Take 1 tablet (600 mg total) by mouth every 6 (six) hours as needed.       Diet: routine diet  Activity: Advance as tolerated. Pelvic rest for 6 weeks.   Outpatient follow up:4 weeks Follow up Appt:Future Appointments Date Time Provider [place]  04/07/2017 1:00 PM Cameron Sprang, MD [facility] None   Follow up Visit:No Follow-up on file.  Postpartum contraception: IUD Mirena  Newborn Data: Live born female  Birth Weight: 7 lb 6.3 oz (3355 g) APGAR: 8, 9  Newborn Delivery   Birth date/time:  03/16/2017 22:18:00 Delivery type:  Vaginal, Spontaneous Delivery      Baby Feeding: Bottle Disposition:home with mother   03/18/2017 Serita Grammes, CNM 9:19 AM

## 2017-03-18 NOTE — Progress Notes (Signed)
  CSW received consult due to score greater than 9, or positive for SI on [place] Depression Screen (MOB's EDPS was 15).   CSW provided education regarding Baby Blues vs PMADs and provided MOB with information about support groups held at [place] encouraged MOB to evaluate her mental health throughout the postpartum period with the use of the New Mom Checklist developed by Postpartum Progress and notify a medical professional if symptoms arise.  CSW assessed for safety and MOB denied SI and HI.  MOB reports MOB loves infant and feels attached and bonded.  CSW suggested an in-home parenting education program ([employer] Start) and MOB was receptive; CSW made referral to Geophysical data processor, [employer].   MOB was appeared flat during meeting time with CSW.  CSW identifies no further need for intervention at this time or barriers to discharge.  Laurey Arrow, MSW, LCSW Clinical Social Work [phone]

## 2017-04-07 ENCOUNTER — Ambulatory Visit: Payer: Self-pay | Admitting: Neurology

## 2017-04-10 ENCOUNTER — Telehealth: Payer: Self-pay

## 2017-04-10 ENCOUNTER — Ambulatory Visit (INDEPENDENT_AMBULATORY_CARE_PROVIDER_SITE_OTHER): Payer: Medicaid Other | Admitting: Clinical

## 2017-04-10 DIAGNOSIS — F329 Major depressive disorder, single episode, unspecified: Secondary | ICD-10-CM | POA: Diagnosis not present

## 2017-04-10 DIAGNOSIS — F4323 Adjustment disorder with mixed anxiety and depressed mood: Secondary | ICD-10-CM | POA: Diagnosis not present

## 2017-04-10 DIAGNOSIS — F32A Depression, unspecified: Secondary | ICD-10-CM

## 2017-04-10 MED ORDER — SERTRALINE HCL 50 MG PO TABS: 50.0000 mg | ORAL_TABLET | Freq: Every day | ORAL | 1 refills | Status: DC
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
## 2017-04-10 NOTE — BH Specialist Note (Signed)
Integrated Behavioral Health Initial Visit  MRN: [id number] Name: Candace Barnes  Number of [place] Clinician visits:: 1/6 Session Start time: 4:00  Session End time: 4:55 Total time: 1 hour  Type of Service: [place] Interpretor:No. Interpretor Name and Language: n/a   Warm Hand Off Completed.       SUBJECTIVE: Candace Barnes is a 26 y.o. female accompanied by N/A Patient was referred by Dr Rip Harbour for depression Patient reports the following symptoms/concerns: Pt states her primary concerns are feeling depressed, fatigue, insomnia caring for baby, difficulty concentrating, low motivation, difficulty relaxing, lack of appetite. Duration of problem: Postpartum; Severity of problem: moderate  OBJECTIVE: Mood: Depressed and Affect: Depressed Risk of harm to self or others: No plan to harm self or others   LIFE CONTEXT: Family and Social: Lives with her supportive parents School/Work: On maternity leave Self-Care: Recognizing need for self-care Life Changes: Recent childbirth   GOALS ADDRESSED: Patient will: 1. Reduce symptoms of: anxiety, depression and insomnia 2. Increase knowledge and/or ability of: coping skills  3. Demonstrate ability to: Increase healthy adjustment to current life circumstances and Increase adequate support systems for patient/family  INTERVENTIONS: Interventions utilized: Mindfulness or Psychologist, educational, Psychoeducation and/or Health Education and Link to [employer]  Standardized Assessments completed: GAD-7 and PHQ 9   ASSESSMENT: Patient currently experiencing Adjustment disorder with mixed anxious and depressed mood.   Patient may benefit from psychoeducation and brief therapeutic intervention regarding coping with symptoms of anxiety and depression.  PLAN: 1. Follow up with behavioral health clinician on : Two days via phone 2. Behavioral recommendations:  -Pick up new [place]  medication today -Begin taking iron with orange juice instead of water -Eat small amount of protein every 4-5 hours during daytime -CALM relaxation breathing exercise daily, every morning -Consider uplifting music, comedy, and short walks outside as daily medicine 3. Referral(s): [place] (In Clinic) 4. "From scale of 1-10, how likely are you to follow plan?": 8  Garlan Fair, LCSWA  Depression screen [facility] 2/9 04/10/2017 01/02/2017 12/12/2016 11/14/2016 10/14/2016  Decreased Interest 2 0 0 0 0  Down, Depressed, Hopeless 2 0 0 0 0  PHQ - 2 Score 4 0 0 0 0  Altered sleeping 3 3 3 1  0  Tired, decreased energy 3 0 1 1 2   Change in appetite 1 1 2  0 0  Feeling bad or failure about yourself  1 0 0 0 2  Trouble concentrating 2 0 0 0 2  Moving slowly or fidgety/restless 1 0 0 0 2  Suicidal thoughts 0 0 0 0 0  PHQ-9 Score 15 4 6 2 8    GAD 7 : Generalized Anxiety Score 04/10/2017 01/02/2017 12/12/2016 11/14/2016  Nervous, Anxious, on Edge 2 0 0 1  Control/stop worrying 2 0 0 1  Worry too much - different things 2 0 0 2  Trouble relaxing 3 1 2 1   Restless 2 0 0 1  Easily annoyed or irritable 2 0 0 1  Afraid - awful might happen 2 0 0 1  Total GAD 7 Score 15 1 2  8

## 2017-04-10 NOTE — Progress Notes (Signed)
D/W [facility]. Patient with postpartum depression. Will start zoloft 25mg  qd x 1 week and then increase to 50mg  qd. Patient has FU appt in 2 weeks.

## 2017-04-10 NOTE — Patient Instructions (Signed)

## 2017-04-13 ENCOUNTER — Telehealth: Payer: Self-pay | Admitting: Clinical

## 2017-04-13 NOTE — Telephone Encounter (Signed)
Integrated Behavioral Health Medication Management Phone Note  MRN: [id number] NAME: Candace Barnes  Time Call Initiated: 1:48 PM  Time Call Completed: 1:50 PM  Total Call Time: 2 minutes  Current Medications:  Outpatient Medications Prior to Visit  Medication Sig Dispense Refill  . Ferrous Fumarate (HEMOCYTE - 106 MG FE) 324 (106 Fe) MG TABS tablet Take 1 tablet (106 mg of iron total) by mouth 2 (two) times daily. 30 tablet 3  . ibuprofen (ADVIL,MOTRIN) 600 MG tablet Take 1 tablet (600 mg total) by mouth every 6 (six) hours as needed. 30 tablet 0  . sertraline (ZOLOFT) 50 MG tablet Take 1 tablet (50 mg total) daily by mouth. Start with 1/2 tab for one week, and then 1 tab 30 tablet 1   No facility-administered medications prior to visit.     Patient has been able to get all medications filled as prescribed: Yes  Patient is currently taking all medications as prescribed: Yes  Patient reports experiencing side effects: No  Patient describes feeling this way on medications: "more calm, more happy"  Additional patient concerns: no  Patient advised to schedule appointment with provider for evaluation of medication side effects or additional concerns: No, pt already has a scheduled appointment with her medical provider on 04/18/17; is aware she may make another appointment with [facility], as needed   [employer], [place]

## 2017-04-18 ENCOUNTER — Encounter: Payer: Self-pay | Admitting: Obstetrics and Gynecology

## 2017-04-18 ENCOUNTER — Ambulatory Visit (INDEPENDENT_AMBULATORY_CARE_PROVIDER_SITE_OTHER): Payer: Medicaid Other | Admitting: Obstetrics and Gynecology

## 2017-04-18 VITALS — BP 107/65 | HR 59 | Ht 72.0 in | Wt 137.7 lb

## 2017-04-18 DIAGNOSIS — Z1389 Encounter for screening for other disorder: Secondary | ICD-10-CM | POA: Diagnosis not present

## 2017-04-18 DIAGNOSIS — Z3202 Encounter for pregnancy test, result negative: Secondary | ICD-10-CM | POA: Diagnosis not present

## 2017-04-18 DIAGNOSIS — Z30430 Encounter for insertion of intrauterine contraceptive device: Secondary | ICD-10-CM

## 2017-04-18 LAB — POCT PREGNANCY, URINE: PREG TEST UR: NEGATIVE

## 2017-04-18 MED ORDER — LEVONORGESTREL 18.6 MCG/DAY IU IUD
INTRAUTERINE_SYSTEM | Freq: Once | INTRAUTERINE | Status: AC
  Administered 2017-04-18: 17:00:00 via INTRAUTERINE

## 2017-04-18 NOTE — Progress Notes (Signed)
Subjective:     Candace Barnes is a 26 y.o. female who presents for a postpartum visit. She is 4 weeks postpartum following a spontaneous vaginal delivery. I have fully reviewed the prenatal and intrapartum course. The delivery was at 43 gestational weeks. Outcome: spontaneous vaginal delivery. Anesthesia: none. Postpartum course has been uncomplicated. Baby's course has been uncomplicated. Baby is feeding by bottle - Gerber Gentle . Bleeding no bleeding. Bowel function is normal. Bladder function is normal. Patient is not sexually active. Contraception method is IUD. Postpartum depression screening: positive. The patient admits to "being stressed because of work situation". 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  She is worried about not having reliable employment to care for her baby next year.  The following portions of the patient's history were reviewed and updated as appropriate: allergies, current medications, past family history, past medical history, past social history, past surgical history and problem list.  Review of Systems Constitutional: negative Eyes: negative Ears, nose, mouth, throat, and face: negative Respiratory: negative Cardiovascular: negative Gastrointestinal: negative Genitourinary:negative Integument/breast: positive for dryness and h/o eczema Hematologic/lymphatic: negative Musculoskeletal:negative Neurological: negative Behavioral/Psych: negative Endocrine: negative Allergic/Immunologic: negative   Objective:    There were no vitals taken for this visit.  General:  alert, cooperative and no distress   Breasts:  inspection negative, no nipple discharge or bleeding, no masses or nodularity palpable  Lungs: clear to auscultation bilaterally  Heart:  regular rate and rhythm, S1, S2 normal, no murmur, click, rub or gallop  Abdomen: soft, non-tender; bowel sounds normal; no masses,  no organomegaly   Vulva:  positive for laceration  well healed, but healed in two pieces -- labial repair on labial minora stated in delivery note  Vagina: normal vagina  Cervix:  multiparous appearance, no cervical motion tenderness and IUD inserted  Corpus: normal size, contour, position, consistency, mobility, non-tender  Adnexa:  normal adnexa  Rectal Exam: Not performed.        Assessment:     Normal postpartum exam. Liletta IUD inserted in fundus of uterus in standard fashion. Pap smear not done at today's visit.   Plan:    1. Contraception: IUD - Liletta 2.  Follow up in: 1 year or as needed.

## 2017-04-18 NOTE — Patient Instructions (Signed)

## 2017-05-10 ENCOUNTER — Emergency Department (HOSPITAL_COMMUNITY)
Admission: EM | Admit: 2017-05-10 | Discharge: 2017-05-10 | Discharge status: Against Medical Advice | Payer: Medicaid Other | Attending: Emergency Medicine | Admitting: Emergency Medicine

## 2017-05-10 ENCOUNTER — Other Ambulatory Visit: Payer: Self-pay

## 2017-05-10 ENCOUNTER — Encounter (HOSPITAL_COMMUNITY): Payer: Self-pay | Admitting: Emergency Medicine

## 2017-05-10 DIAGNOSIS — Z5321 Procedure and treatment not carried out due to patient leaving prior to being seen by health care provider: Secondary | ICD-10-CM | POA: Diagnosis not present

## 2017-05-10 DIAGNOSIS — R42 Dizziness and giddiness: Secondary | ICD-10-CM | POA: Diagnosis not present

## 2017-05-10 HISTORY — DX: Personal history of other diseases of the nervous system and sense organs: Z86.69

## 2017-05-10 HISTORY — DX: Personal history of other specified conditions: Z87.898

## 2017-05-10 LAB — CBC
HCT: 31.9 % — ABNORMAL LOW (ref 36.0–46.0)
HEMOGLOBIN: 9.8 g/dL — AB (ref 12.0–15.0)
MCH: 20.4 pg — AB (ref 26.0–34.0)
MCHC: 30.7 g/dL (ref 30.0–36.0)
MCV: 66.5 fL — ABNORMAL LOW (ref 78.0–100.0)
PLATELETS: 219 10*3/uL (ref 150–400)
RBC: 4.8 MIL/uL (ref 3.87–5.11)
RDW: 18.1 % — ABNORMAL HIGH (ref 11.5–15.5)
WBC: 4.7 10*3/uL (ref 4.0–10.5)

## 2017-05-10 LAB — BASIC METABOLIC PANEL
Anion gap: 6 (ref 5–15)
BUN: 5 mg/dL — ABNORMAL LOW (ref 6–20)
CALCIUM: 9 mg/dL (ref 8.9–10.3)
CO2: 26 mmol/L (ref 22–32)
CREATININE: 0.76 mg/dL (ref 0.44–1.00)
Chloride: 103 mmol/L (ref 101–111)
GFR calc Af Amer: >60 mL/min (ref >60)
GFR calc non Af Amer: >60 mL/min (ref >60)
Glucose, Bld: 82 mg/dL (ref 65–99)
Potassium: 3.6 mmol/L (ref 3.5–5.1)
Sodium: 135 mmol/L (ref 135–145)

## 2017-05-10 LAB — I-STAT BETA HCG BLOOD, ED (MC, WL, AP ONLY): I-stat hCG, quantitative: <5 m[IU]/mL (ref <5)
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
## 2019-10-04 ENCOUNTER — Emergency Department
Admission: EM | Admit: 2019-10-04 | Discharge: 2019-10-04 | Disposition: A | Discharge status: Home / Self Care | Payer: Medicaid Other | Attending: Emergency Medicine | Admitting: Emergency Medicine

## 2019-10-04 ENCOUNTER — Other Ambulatory Visit: Payer: Self-pay

## 2019-10-04 DIAGNOSIS — J069 Acute upper respiratory infection, unspecified: Secondary | ICD-10-CM | POA: Insufficient documentation

## 2019-10-04 DIAGNOSIS — Z87891 Personal history of nicotine dependence: Secondary | ICD-10-CM | POA: Insufficient documentation

## 2019-10-04 DIAGNOSIS — Z20822 Contact with and (suspected) exposure to covid-19: Secondary | ICD-10-CM | POA: Insufficient documentation

## 2019-10-04 LAB — SARS CORONAVIRUS 2 (TAT 6-24 HRS): SARS Coronavirus 2: NEGATIVE

## 2019-10-04 MED ORDER — CETIRIZINE HCL 5 MG/5ML PO SYRP
5.0000 mg | ORAL_SOLUTION | Freq: Once | ORAL | Status: DC
  Filled 2019-10-04: qty 5

## 2019-10-04 MED ORDER — LORATADINE 10 MG PO TABS
10.0000 mg | ORAL_TABLET | Freq: Once | ORAL | Status: AC
  Administered 2019-10-04: 10 mg via ORAL
  Filled 2019-10-04: qty 1

## 2019-10-04 MED ORDER — FLUTICASONE PROPIONATE 50 MCG/ACT NA SUSP: 2.0000 | Freq: Every day | NASAL | 0 refills | Status: DC

## 2019-10-04 MED ORDER — BENZONATATE 100 MG PO CAPS
100.0000 mg | ORAL_CAPSULE | Freq: Once | ORAL | Status: AC
  Administered 2019-10-04: 09:00:00 100 mg via ORAL
  Filled 2019-10-04: qty 1

## 2019-10-04 MED ORDER — BENZONATATE 100 MG PO CAPS: 100.0000 mg | ORAL_CAPSULE | Freq: Three times a day (TID) | ORAL | 0 refills | Status: AC | PRN

## 2019-10-04 NOTE — ED Triage Notes (Signed)
Runny nose and cough X 3 days. Needs note to go back to work. .Pt alert and oriented X4, cooperative, RR even and unlabored, color WNL. Pt in NAD.

## 2019-10-04 NOTE — ED Notes (Signed)
See triage note  Presents with runny nose and sore throat which started 2 days ago
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
## 2020-01-17 ENCOUNTER — Encounter (HOSPITAL_COMMUNITY): Payer: Self-pay

## 2020-01-17 ENCOUNTER — Ambulatory Visit (HOSPITAL_COMMUNITY)
Admission: EM | Admit: 2020-01-17 | Discharge: 2020-01-17 | Disposition: A | Discharge status: Home / Self Care | Payer: Medicaid Other

## 2020-01-17 ENCOUNTER — Other Ambulatory Visit: Payer: Self-pay

## 2020-01-17 DIAGNOSIS — Z711 Person with feared health complaint in whom no diagnosis is made: Secondary | ICD-10-CM

## 2020-01-17 NOTE — Discharge Instructions (Addendum)
You do not have any signs of respiratory infection including HSV.  You can return to work without any restrictions.

## 2020-01-17 NOTE — ED Triage Notes (Signed)
Pt states needs work note, daughter has RSV. Work wants to make sure pt is cleared to go back to work. Pt states has never had sx.
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
## 2020-01-30 ENCOUNTER — Other Ambulatory Visit: Payer: Self-pay

## 2020-01-30 ENCOUNTER — Emergency Department
Admission: EM | Admit: 2020-01-30 | Discharge: 2020-01-30 | Disposition: A | Discharge status: Home / Self Care | Payer: HRSA Program | Attending: Emergency Medicine | Admitting: Emergency Medicine

## 2020-01-30 ENCOUNTER — Encounter: Payer: Self-pay | Admitting: Emergency Medicine

## 2020-01-30 DIAGNOSIS — Z87891 Personal history of nicotine dependence: Secondary | ICD-10-CM | POA: Diagnosis not present

## 2020-01-30 DIAGNOSIS — Z20822 Contact with and (suspected) exposure to covid-19: Secondary | ICD-10-CM

## 2020-01-30 LAB — SARS CORONAVIRUS 2 BY RT PCR (HOSPITAL ORDER, PERFORMED IN ~~LOC~~ HOSPITAL LAB): SARS Coronavirus 2: NEGATIVE

## 2020-01-30 NOTE — Discharge Instructions (Signed)
Your Covid test results should be ready in 2 hours possibly  less.  Please return as needed

## 2020-01-30 NOTE — ED Triage Notes (Signed)
Patient presents to the ED for a covid test for work.  Patient is asymptomatic and has no covid contacts.  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  Patient states she does not have a PCP which is why she came to the ED.
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
## 2020-01-30 NOTE — ED Notes (Signed)
Pt verbalizes understanding of d/c instructions, medications and follow up 

## 2020-02-15 ENCOUNTER — Other Ambulatory Visit: Payer: Self-pay

## 2020-02-15 ENCOUNTER — Emergency Department
Admission: EM | Admit: 2020-02-15 | Discharge: 2020-02-15 | Disposition: A | Discharge status: Home / Self Care | Payer: HRSA Program | Attending: Emergency Medicine | Admitting: Emergency Medicine

## 2020-02-15 DIAGNOSIS — Z87891 Personal history of nicotine dependence: Secondary | ICD-10-CM | POA: Diagnosis not present

## 2020-02-15 DIAGNOSIS — Z20822 Contact with and (suspected) exposure to covid-19: Secondary | ICD-10-CM | POA: Insufficient documentation

## 2020-02-15 DIAGNOSIS — J069 Acute upper respiratory infection, unspecified: Secondary | ICD-10-CM | POA: Insufficient documentation

## 2020-02-15 DIAGNOSIS — U071 COVID-19: Secondary | ICD-10-CM

## 2020-02-15 LAB — SARS CORONAVIRUS 2 BY RT PCR (HOSPITAL ORDER, PERFORMED IN ~~LOC~~ HOSPITAL LAB): SARS Coronavirus 2: NEGATIVE

## 2020-02-15 NOTE — Discharge Instructions (Signed)
Follow-up with your primary care provider if any continued problems. Tylenol and ibuprofen if needed for body aches, headache or fever. You may see the results of your Covid test on my chart. You'll need to stay out of work until the results of your test.

## 2020-02-15 NOTE — ED Notes (Signed)
Pt wants covid test for work. Right ear felt clogged.  Also c/o cough when takes a deep breath and headache.
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
## 2020-02-15 NOTE — ED Triage Notes (Signed)
Pt arrived via POV with reports of cough and shortness of breath, wants COVID test. Reports sxs for 4 days. No distress noted, pt has young child with her.

## 2020-02-23 ENCOUNTER — Other Ambulatory Visit: Payer: Self-pay

## 2020-02-23 ENCOUNTER — Encounter: Payer: Self-pay | Admitting: Emergency Medicine

## 2020-02-23 ENCOUNTER — Emergency Department
Admission: EM | Admit: 2020-02-23 | Discharge: 2020-02-23 | Disposition: A | Discharge status: Home / Self Care | Payer: Medicaid Other | Attending: Emergency Medicine | Admitting: Emergency Medicine

## 2020-02-23 DIAGNOSIS — Z5321 Procedure and treatment not carried out due to patient leaving prior to being seen by health care provider: Secondary | ICD-10-CM | POA: Insufficient documentation

## 2020-02-23 DIAGNOSIS — R569 Unspecified convulsions: Secondary | ICD-10-CM | POA: Insufficient documentation

## 2020-02-23 LAB — COMPREHENSIVE METABOLIC PANEL
ALT: 12 U/L (ref 0–44)
AST: 13 U/L — ABNORMAL LOW (ref 15–41)
Albumin: 4.4 g/dL (ref 3.5–5.0)
Alkaline Phosphatase: 40 U/L (ref 38–126)
Anion gap: 8 (ref 5–15)
BUN: 13 mg/dL (ref 6–20)
CO2: 27 mmol/L (ref 22–32)
Calcium: 9.3 mg/dL (ref 8.9–10.3)
Chloride: 103 mmol/L (ref 98–111)
Creatinine, Ser: 1 mg/dL (ref 0.44–1.00)
GFR calc Af Amer: >60 mL/min (ref >60)
GFR calc non Af Amer: >60 mL/min (ref >60)
Glucose, Bld: 98 mg/dL (ref 70–99)
Potassium: 3.8 mmol/L (ref 3.5–5.1)
Sodium: 138 mmol/L (ref 135–145)
Total Bilirubin: 2.5 mg/dL — ABNORMAL HIGH (ref 0.3–1.2)
Total Protein: 8.2 g/dL — ABNORMAL HIGH (ref 6.5–8.1)

## 2020-02-23 LAB — CBC
HCT: 34.8 % — ABNORMAL LOW (ref 36.0–46.0)
Hemoglobin: 11.1 g/dL — ABNORMAL LOW (ref 12.0–15.0)
MCH: 21.9 pg — ABNORMAL LOW (ref 26.0–34.0)
MCHC: 31.9 g/dL (ref 30.0–36.0)
MCV: 68.5 fL — ABNORMAL LOW (ref 80.0–100.0)
Platelets: 273 10*3/uL (ref 150–400)
RBC: 5.08 MIL/uL (ref 3.87–5.11)
RDW: 19 % — ABNORMAL HIGH (ref 11.5–15.5)
WBC: 6 10*3/uL (ref 4.0–10.5)
nRBC: 0 % (ref 0.0–0.2)

## 2020-02-23 NOTE — ED Notes (Signed)
Na when called for vs

## 2020-02-23 NOTE — ED Triage Notes (Signed)
Patient states that she had a few seizures tonight at work about 20:30. 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 Patient states that she use to take Keppra but that it did not help.

## 2020-02-23 NOTE — ED Notes (Signed)
Na when called for repeat vs

## 2020-02-24 ENCOUNTER — Encounter: Payer: Self-pay | Admitting: Emergency Medicine

## 2020-02-24 ENCOUNTER — Other Ambulatory Visit: Payer: Self-pay

## 2020-02-24 ENCOUNTER — Emergency Department
Admission: EM | Admit: 2020-02-24 | Discharge: 2020-02-24 | Disposition: A | Discharge status: Home / Self Care | Payer: Medicaid Other | Attending: Emergency Medicine | Admitting: Emergency Medicine

## 2020-02-24 DIAGNOSIS — Z87891 Personal history of nicotine dependence: Secondary | ICD-10-CM | POA: Insufficient documentation

## 2020-02-24 DIAGNOSIS — F32A Depression, unspecified: Secondary | ICD-10-CM

## 2020-02-24 DIAGNOSIS — F329 Major depressive disorder, single episode, unspecified: Secondary | ICD-10-CM | POA: Insufficient documentation

## 2020-02-24 DIAGNOSIS — Z79899 Other long term (current) drug therapy: Secondary | ICD-10-CM | POA: Insufficient documentation

## 2020-02-24 DIAGNOSIS — Z716 Tobacco abuse counseling: Secondary | ICD-10-CM | POA: Insufficient documentation

## 2020-02-24 LAB — CBC
HCT: 33.7 % — ABNORMAL LOW (ref 36.0–46.0)
Hemoglobin: 11.2 g/dL — ABNORMAL LOW (ref 12.0–15.0)
MCH: 22.2 pg — ABNORMAL LOW (ref 26.0–34.0)
MCHC: 33.2 g/dL (ref 30.0–36.0)
MCV: 66.7 fL — ABNORMAL LOW (ref 80.0–100.0)
Platelets: 278 10*3/uL (ref 150–400)
RBC: 5.05 MIL/uL (ref 3.87–5.11)
RDW: 19.1 % — ABNORMAL HIGH (ref 11.5–15.5)
WBC: 6.2 10*3/uL (ref 4.0–10.5)
nRBC: 0 % (ref 0.0–0.2)

## 2020-02-24 LAB — COMPREHENSIVE METABOLIC PANEL
ALT: 13 U/L (ref 0–44)
AST: 17 U/L (ref 15–41)
Albumin: 4.5 g/dL (ref 3.5–5.0)
Alkaline Phosphatase: 43 U/L (ref 38–126)
Anion gap: 8 (ref 5–15)
BUN: 16 mg/dL (ref 6–20)
CO2: 26 mmol/L (ref 22–32)
Calcium: 8.8 mg/dL — ABNORMAL LOW (ref 8.9–10.3)
Chloride: 101 mmol/L (ref 98–111)
Creatinine, Ser: 0.89 mg/dL (ref 0.44–1.00)
GFR calc Af Amer: >60 mL/min (ref >60)
GFR calc non Af Amer: >60 mL/min (ref >60)
Glucose, Bld: 90 mg/dL (ref 70–99)
Potassium: 3.9 mmol/L (ref 3.5–5.1)
Sodium: 135 mmol/L (ref 135–145)
Total Bilirubin: 2.5 mg/dL — ABNORMAL HIGH (ref 0.3–1.2)
Total Protein: 8.1 g/dL (ref 6.5–8.1)

## 2020-02-24 LAB — URINE DRUG SCREEN, QUALITATIVE (ARMC ONLY)
Amphetamines, Ur Screen: NOT DETECTED
Barbiturates, Ur Screen: NOT DETECTED
Benzodiazepine, Ur Scrn: NOT DETECTED
Cannabinoid 50 Ng, Ur ~~LOC~~: NOT DETECTED
Cocaine Metabolite,Ur ~~LOC~~: NOT DETECTED
MDMA (Ecstasy)Ur Screen: NOT DETECTED
Methadone Scn, Ur: NOT DETECTED
Opiate, Ur Screen: NOT DETECTED
Phencyclidine (PCP) Ur S: NOT DETECTED
Tricyclic, Ur Screen: NOT DETECTED

## 2020-02-24 LAB — SALICYLATE LEVEL: Salicylate Lvl: <7 mg/dL — ABNORMAL LOW (ref 7.0–30.0)

## 2020-02-24 LAB — ETHANOL: Alcohol, Ethyl (B): <10 mg/dL (ref <10)

## 2020-02-24 LAB — POCT PREGNANCY, URINE: Preg Test, Ur: NEGATIVE

## 2020-02-24 LAB — ACETAMINOPHEN LEVEL: Acetaminophen (Tylenol), Serum: <10 ug/mL — ABNORMAL LOW (ref 10–30)

## 2020-02-24 NOTE — ED Triage Notes (Signed)
Pt ambulatory to triage with no difficulty. Pt reports she has hx of depression and was last on medications in 2013 in high school. States she has been feeling like she needs to be on medications again. Pt denies SI/HI, AVH. Pt is calm and cooperative and reports she brought herself here and her mom is aware she is here.
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
## 2020-10-18 ENCOUNTER — Other Ambulatory Visit: Payer: Self-pay

## 2020-10-18 ENCOUNTER — Emergency Department
Admission: EM | Admit: 2020-10-18 | Discharge: 2020-10-18 | Disposition: A | Discharge status: Home / Self Care | Payer: Medicaid Other | Attending: Emergency Medicine | Admitting: Emergency Medicine

## 2020-10-18 DIAGNOSIS — Z87891 Personal history of nicotine dependence: Secondary | ICD-10-CM | POA: Diagnosis not present

## 2020-10-18 DIAGNOSIS — J029 Acute pharyngitis, unspecified: Secondary | ICD-10-CM | POA: Diagnosis not present

## 2020-10-18 DIAGNOSIS — Z20822 Contact with and (suspected) exposure to covid-19: Secondary | ICD-10-CM | POA: Diagnosis not present

## 2020-10-18 LAB — GROUP A STREP BY PCR: Group A Strep by PCR: NOT DETECTED

## 2020-10-18 MED ORDER — LIDOCAINE VISCOUS HCL 2 % MT SOLN: 5.0000 mL | Freq: Four times a day (QID) | OROMUCOSAL | 0 refills | Status: DC | PRN

## 2020-10-18 MED ORDER — PSEUDOEPH-BROMPHEN-DM 30-2-10 MG/5ML PO SYRP: 5.0000 mL | ORAL_SOLUTION | Freq: Four times a day (QID) | ORAL | 0 refills | Status: DC | PRN
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
## 2020-10-18 NOTE — ED Triage Notes (Signed)
Pt to ED for sore throat x3 days. States she felt similar when tested positive for COVID a few months ago

## 2020-10-18 NOTE — ED Notes (Signed)
See triage note  Presents with sore throat for about 3 days  Hard to swallow  Today  Afebrile on arrival

## 2020-10-18 NOTE — Discharge Instructions (Signed)
Follow discharge care instructions and take medication as directed.  If COVID-19 test is positive must quarantine additional 5 days.

## 2020-10-19 LAB — SARS CORONAVIRUS 2 (TAT 6-24 HRS): SARS Coronavirus 2: NEGATIVE

## 2020-12-15 ENCOUNTER — Emergency Department (HOSPITAL_COMMUNITY)
Admission: EM | Admit: 2020-12-15 | Discharge: 2020-12-15 | Disposition: A | Discharge status: Home / Self Care | Payer: Medicaid Other | Attending: Emergency Medicine | Admitting: Emergency Medicine

## 2020-12-15 ENCOUNTER — Encounter (HOSPITAL_COMMUNITY): Payer: Self-pay

## 2020-12-15 ENCOUNTER — Other Ambulatory Visit: Payer: Self-pay

## 2020-12-15 DIAGNOSIS — Z87891 Personal history of nicotine dependence: Secondary | ICD-10-CM | POA: Diagnosis not present

## 2020-12-15 DIAGNOSIS — K0889 Other specified disorders of teeth and supporting structures: Secondary | ICD-10-CM | POA: Diagnosis not present

## 2020-12-15 MED ORDER — PENICILLIN V POTASSIUM 500 MG PO TABS: 500.0000 mg | ORAL_TABLET | Freq: Four times a day (QID) | ORAL | 0 refills | Status: AC
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
## 2020-12-15 NOTE — ED Triage Notes (Signed)
Patient complains of right lower dental pain with radiation to right ear x 1 day. Denies any known dental problem

## 2020-12-15 NOTE — Discharge Instructions (Addendum)
Possibly have a dental infection, started  you on antibiotics, please take as prescribed.  I recommend over-the-counter pain medications like ibuprofen or Tylenol every 6 or as needed please for dosing back a bottle.  Please follow-up with a dentist for any further evaluation.  Come back to the emergency department if you develop chest pain, shortness of breath, severe abdominal pain, uncontrolled nausea, vomiting, diarrhea.

## 2020-12-30 ENCOUNTER — Emergency Department
Admission: EM | Admit: 2020-12-30 | Discharge: 2020-12-30 | Disposition: A | Discharge status: Home / Self Care | Payer: Medicaid Other | Attending: Emergency Medicine | Admitting: Emergency Medicine

## 2020-12-30 ENCOUNTER — Other Ambulatory Visit: Payer: Self-pay

## 2020-12-30 ENCOUNTER — Emergency Department: Payer: Medicaid Other

## 2020-12-30 DIAGNOSIS — N939 Abnormal uterine and vaginal bleeding, unspecified: Secondary | ICD-10-CM | POA: Diagnosis not present

## 2020-12-30 DIAGNOSIS — R42 Dizziness and giddiness: Secondary | ICD-10-CM | POA: Diagnosis not present

## 2020-12-30 DIAGNOSIS — Z87891 Personal history of nicotine dependence: Secondary | ICD-10-CM | POA: Diagnosis not present

## 2020-12-30 DIAGNOSIS — R002 Palpitations: Secondary | ICD-10-CM | POA: Diagnosis not present

## 2020-12-30 LAB — CBC
HCT: 36.4 % (ref 36.0–46.0)
Hemoglobin: 11.2 g/dL — ABNORMAL LOW (ref 12.0–15.0)
MCH: 20.2 pg — ABNORMAL LOW (ref 26.0–34.0)
MCHC: 30.8 g/dL (ref 30.0–36.0)
MCV: 65.7 fL — ABNORMAL LOW (ref 80.0–100.0)
Platelets: 251 10*3/uL (ref 150–400)
RBC: 5.54 MIL/uL — ABNORMAL HIGH (ref 3.87–5.11)
RDW: 20.1 % — ABNORMAL HIGH (ref 11.5–15.5)
WBC: 5.1 10*3/uL (ref 4.0–10.5)
nRBC: 0 % (ref 0.0–0.2)

## 2020-12-30 LAB — BASIC METABOLIC PANEL
Anion gap: 7 (ref 5–15)
BUN: 12 mg/dL (ref 6–20)
CO2: 27 mmol/L (ref 22–32)
Calcium: 8.9 mg/dL (ref 8.9–10.3)
Chloride: 101 mmol/L (ref 98–111)
Creatinine, Ser: 0.95 mg/dL (ref 0.44–1.00)
GFR, Estimated: >60 mL/min (ref >60)
Glucose, Bld: 78 mg/dL (ref 70–99)
Potassium: 3.6 mmol/L (ref 3.5–5.1)
Sodium: 135 mmol/L (ref 135–145)

## 2020-12-30 LAB — TROPONIN I (HIGH SENSITIVITY): Troponin I (High Sensitivity): <2 ng/L (ref <18)

## 2020-12-30 LAB — POC URINE PREG, ED: Preg Test, Ur: NEGATIVE

## 2020-12-30 NOTE — ED Triage Notes (Signed)
Pt comes pov with feeling like her heart is beating fast and shakiness while at work today. Tried eating and drinking a gaterade with no relief. Prior to that had a banana for breakfast. States that she doesn't drink much water.
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
## 2020-12-31 LAB — POC URINE PREG, ED: Preg Test, Ur: NEGATIVE

## 2021-04-11 ENCOUNTER — Other Ambulatory Visit: Payer: Self-pay

## 2021-04-11 ENCOUNTER — Encounter: Payer: Self-pay | Admitting: Emergency Medicine

## 2021-04-11 ENCOUNTER — Emergency Department
Admission: EM | Admit: 2021-04-11 | Discharge: 2021-04-12 | Disposition: A | Discharge status: Home / Self Care | Payer: Medicaid Other | Attending: Emergency Medicine | Admitting: Emergency Medicine

## 2021-04-11 DIAGNOSIS — Z5321 Procedure and treatment not carried out due to patient leaving prior to being seen by health care provider: Secondary | ICD-10-CM | POA: Diagnosis not present

## 2021-04-11 DIAGNOSIS — N939 Abnormal uterine and vaginal bleeding, unspecified: Secondary | ICD-10-CM | POA: Diagnosis present

## 2021-04-11 LAB — CBC WITH DIFFERENTIAL/PLATELET
Abs Immature Granulocytes: 0.03 10*3/uL (ref 0.00–0.07)
Basophils Absolute: 0 10*3/uL (ref 0.0–0.1)
Basophils Relative: 0 %
Eosinophils Absolute: 0.1 10*3/uL (ref 0.0–0.5)
Eosinophils Relative: 1 %
HCT: 37.2 % (ref 36.0–46.0)
Hemoglobin: 11.6 g/dL — ABNORMAL LOW (ref 12.0–15.0)
Immature Granulocytes: 1 %
Lymphocytes Relative: 31 %
Lymphs Abs: 2 10*3/uL (ref 0.7–4.0)
MCH: 20.9 pg — ABNORMAL LOW (ref 26.0–34.0)
MCHC: 31.2 g/dL (ref 30.0–36.0)
MCV: 66.9 fL — ABNORMAL LOW (ref 80.0–100.0)
Monocytes Absolute: 0.4 10*3/uL (ref 0.1–1.0)
Monocytes Relative: 6 %
Neutro Abs: 4.1 10*3/uL (ref 1.7–7.7)
Neutrophils Relative %: 61 %
Platelets: 287 10*3/uL (ref 150–400)
RBC: 5.56 MIL/uL — ABNORMAL HIGH (ref 3.87–5.11)
RDW: 19.7 % — ABNORMAL HIGH (ref 11.5–15.5)
Smear Review: NORMAL
WBC: 6.5 10*3/uL (ref 4.0–10.5)
nRBC: 0 % (ref 0.0–0.2)

## 2021-04-11 LAB — BASIC METABOLIC PANEL
Anion gap: 6 (ref 5–15)
BUN: 13 mg/dL (ref 6–20)
CO2: 25 mmol/L (ref 22–32)
Calcium: 8.6 mg/dL — ABNORMAL LOW (ref 8.9–10.3)
Chloride: 106 mmol/L (ref 98–111)
Creatinine, Ser: 0.85 mg/dL (ref 0.44–1.00)
GFR, Estimated: >60 mL/min (ref >60)
Glucose, Bld: 90 mg/dL (ref 70–99)
Potassium: 4.5 mmol/L (ref 3.5–5.1)
Sodium: 137 mmol/L (ref 135–145)

## 2021-04-11 LAB — HCG, QUANTITATIVE, PREGNANCY: hCG, Beta Chain, Quant, S: <1 m[IU]/mL (ref <5)

## 2021-04-11 NOTE — ED Triage Notes (Signed)
Pt to ED via POV with c/o taking a Plan B pill on Thursday and has been having cramping and vaginal bleeding for the last two days. She reports that she has never cramped or bleed this bad with her regular period and it has her worried. She is changing her pad every time she urinates.

## 2021-04-12 NOTE — ED Notes (Signed)
Pt to front desk to inform staff she was leaving.

## 2021-04-14 ENCOUNTER — Encounter: Payer: Self-pay | Admitting: Emergency Medicine

## 2021-04-14 ENCOUNTER — Emergency Department
Admission: EM | Admit: 2021-04-14 | Discharge: 2021-04-15 | Disposition: A | Discharge status: Home / Self Care | Payer: Medicaid Other | Attending: Emergency Medicine | Admitting: Emergency Medicine

## 2021-04-14 ENCOUNTER — Other Ambulatory Visit: Payer: Self-pay

## 2021-04-14 DIAGNOSIS — R1011 Right upper quadrant pain: Secondary | ICD-10-CM

## 2021-04-14 DIAGNOSIS — Z87891 Personal history of nicotine dependence: Secondary | ICD-10-CM | POA: Insufficient documentation

## 2021-04-14 DIAGNOSIS — R1030 Lower abdominal pain, unspecified: Secondary | ICD-10-CM

## 2021-04-14 DIAGNOSIS — K5900 Constipation, unspecified: Secondary | ICD-10-CM | POA: Diagnosis not present

## 2021-04-14 DIAGNOSIS — N390 Urinary tract infection, site not specified: Secondary | ICD-10-CM | POA: Diagnosis not present
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
## 2021-04-14 NOTE — ED Triage Notes (Signed)
Pt to ED from home c/o right side pain that started today.  States difficulty breathing, pain to right side that's worse with movement, denies new urinary changes since seen several days ago, denies cough, denies n/v/d.  Pt A&Ox4, chest rise even and unlabored, in NAD at this time.

## 2021-04-15 ENCOUNTER — Emergency Department: Payer: Medicaid Other

## 2021-04-15 LAB — CBC
HCT: 31.4 % — ABNORMAL LOW (ref 36.0–46.0)
Hemoglobin: 9.5 g/dL — ABNORMAL LOW (ref 12.0–15.0)
MCH: 20 pg — ABNORMAL LOW (ref 26.0–34.0)
MCHC: 30.3 g/dL (ref 30.0–36.0)
MCV: 66 fL — ABNORMAL LOW (ref 80.0–100.0)
Platelets: 238 10*3/uL (ref 150–400)
RBC: 4.76 MIL/uL (ref 3.87–5.11)
RDW: 18.6 % — ABNORMAL HIGH (ref 11.5–15.5)
WBC: 6.5 10*3/uL (ref 4.0–10.5)
nRBC: 0 % (ref 0.0–0.2)

## 2021-04-15 LAB — COMPREHENSIVE METABOLIC PANEL
ALT: 13 U/L (ref 0–44)
AST: 17 U/L (ref 15–41)
Albumin: 3.4 g/dL — ABNORMAL LOW (ref 3.5–5.0)
Alkaline Phosphatase: 44 U/L (ref 38–126)
Anion gap: 3 — ABNORMAL LOW (ref 5–15)
BUN: 12 mg/dL (ref 6–20)
CO2: 26 mmol/L (ref 22–32)
Calcium: 8 mg/dL — ABNORMAL LOW (ref 8.9–10.3)
Chloride: 106 mmol/L (ref 98–111)
Creatinine, Ser: 0.76 mg/dL (ref 0.44–1.00)
GFR, Estimated: >60 mL/min (ref >60)
Glucose, Bld: 96 mg/dL (ref 70–99)
Potassium: 3.7 mmol/L (ref 3.5–5.1)
Sodium: 135 mmol/L (ref 135–145)
Total Bilirubin: 1.2 mg/dL (ref 0.3–1.2)
Total Protein: 7.2 g/dL (ref 6.5–8.1)

## 2021-04-15 LAB — URINALYSIS, ROUTINE W REFLEX MICROSCOPIC
Bilirubin Urine: NEGATIVE
Glucose, UA: NEGATIVE mg/dL
Ketones, ur: NEGATIVE mg/dL
Nitrite: POSITIVE — AB
Protein, ur: NEGATIVE mg/dL
Specific Gravity, Urine: 1.015 (ref 1.005–1.030)
WBC, UA: >50 WBC/hpf — ABNORMAL HIGH (ref 0–5)
pH: 6 (ref 5.0–8.0)

## 2021-04-15 LAB — POC URINE PREG, ED: Preg Test, Ur: NEGATIVE

## 2021-04-15 LAB — LIPASE, BLOOD: Lipase: 29 U/L (ref 11–51)

## 2021-04-15 MED ORDER — SODIUM CHLORIDE 0.9 % IV SOLN
1.0000 g | Freq: Once | INTRAVENOUS | Status: AC
  Administered 2021-04-15: 1 g via INTRAVENOUS
  Filled 2021-04-15: qty 10

## 2021-04-15 MED ORDER — ONDANSETRON HCL 4 MG/2ML IJ SOLN
4.0000 mg | Freq: Once | INTRAMUSCULAR | Status: AC
  Administered 2021-04-15: 4 mg via INTRAVENOUS
  Filled 2021-04-15: qty 2

## 2021-04-15 MED ORDER — LACTULOSE 10 GM/15ML PO SOLN: 20.0000 g | Freq: Every day | ORAL | 0 refills | Status: DC | PRN

## 2021-04-15 MED ORDER — KETOROLAC TROMETHAMINE 30 MG/ML IJ SOLN
15.0000 mg | Freq: Once | INTRAMUSCULAR | Status: AC
  Administered 2021-04-15: 15 mg via INTRAVENOUS
  Filled 2021-04-15: qty 1

## 2021-04-15 MED ORDER — FENTANYL CITRATE PF 50 MCG/ML IJ SOSY
50.0000 ug | PREFILLED_SYRINGE | Freq: Once | INTRAMUSCULAR | Status: AC
Start: 2021-04-15 — End: 2021-04-15
  Administered 2021-04-15: 50 ug via INTRAVENOUS
  Filled 2021-04-15: qty 1

## 2021-04-15 MED ORDER — SODIUM CHLORIDE 0.9 % IV BOLUS
500.0000 mL | Freq: Once | INTRAVENOUS | Status: AC
  Administered 2021-04-15: 500 mL via INTRAVENOUS

## 2021-04-15 MED ORDER — CEPHALEXIN 500 MG PO CAPS: 500.0000 mg | ORAL_CAPSULE | Freq: Three times a day (TID) | ORAL | 0 refills | Status: DC

## 2021-04-15 NOTE — Discharge Instructions (Signed)
1.  Take antibiotic as prescribed (Keflex 500mg  3 times daily x7 days). 2.  You may take Lactulose as needed for bowel movements. 3.  Return to the ER for worsening symptoms, persistent vomiting, difficulty breathing or other concerns.

## 2021-04-21 ENCOUNTER — Emergency Department
Admission: EM | Admit: 2021-04-21 | Discharge: 2021-04-21 | Disposition: A | Discharge status: Home / Self Care | Payer: Medicaid Other | Attending: Emergency Medicine | Admitting: Emergency Medicine

## 2021-04-21 ENCOUNTER — Other Ambulatory Visit: Payer: Self-pay

## 2021-04-21 DIAGNOSIS — F17210 Nicotine dependence, cigarettes, uncomplicated: Secondary | ICD-10-CM | POA: Diagnosis not present

## 2021-04-21 DIAGNOSIS — R569 Unspecified convulsions: Secondary | ICD-10-CM | POA: Insufficient documentation

## 2021-04-21 LAB — BASIC METABOLIC PANEL
Anion gap: 5 (ref 5–15)
BUN: 15 mg/dL (ref 6–20)
CO2: 24 mmol/L (ref 22–32)
Calcium: 8.5 mg/dL — ABNORMAL LOW (ref 8.9–10.3)
Chloride: 109 mmol/L (ref 98–111)
Creatinine, Ser: 0.72 mg/dL (ref 0.44–1.00)
GFR, Estimated: >60 mL/min (ref >60)
Glucose, Bld: 95 mg/dL (ref 70–99)
Potassium: 4.1 mmol/L (ref 3.5–5.1)
Sodium: 138 mmol/L (ref 135–145)

## 2021-04-21 LAB — URINALYSIS, COMPLETE (UACMP) WITH MICROSCOPIC
Bacteria, UA: NONE SEEN
Bilirubin Urine: NEGATIVE
Glucose, UA: NEGATIVE mg/dL
Hgb urine dipstick: NEGATIVE
Ketones, ur: NEGATIVE mg/dL
Nitrite: NEGATIVE
Protein, ur: NEGATIVE mg/dL
Specific Gravity, Urine: 1.018 (ref 1.005–1.030)
pH: 6 (ref 5.0–8.0)

## 2021-04-21 LAB — POC URINE PREG, ED: Preg Test, Ur: NEGATIVE

## 2021-04-21 LAB — CBC
HCT: 33.1 % — ABNORMAL LOW (ref 36.0–46.0)
Hemoglobin: 10.3 g/dL — ABNORMAL LOW (ref 12.0–15.0)
MCH: 20.4 pg — ABNORMAL LOW (ref 26.0–34.0)
MCHC: 31.1 g/dL (ref 30.0–36.0)
MCV: 65.5 fL — ABNORMAL LOW (ref 80.0–100.0)
Platelets: 256 10*3/uL (ref 150–400)
RBC: 5.05 MIL/uL (ref 3.87–5.11)
RDW: 18.9 % — ABNORMAL HIGH (ref 11.5–15.5)
WBC: 4 10*3/uL (ref 4.0–10.5)
nRBC: 0 % (ref 0.0–0.2)

## 2021-04-21 MED ORDER — LEVETIRACETAM 500 MG PO TABS: 500.0000 mg | ORAL_TABLET | Freq: Two times a day (BID) | ORAL | 2 refills | Status: DC

## 2021-04-21 NOTE — ED Notes (Signed)
Follow up pcp info provided all questions answered  
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
## 2021-04-21 NOTE — ED Triage Notes (Signed)
Pt comes via  EMS with c/o seizure witnessed by brother. Pt states pain in neck. EMS reports pt was stiff and little postictal upon arrival.  Pt has hx of seizures last one was Jan. Pt was on Keppra back several years ago. Pt stopped meds bc she wasn't having seizures.  Pt A*OX4, Vss

## 2021-04-21 NOTE — Discharge Instructions (Addendum)
As we discussed please do not drive until you have been cleared by neurology.  Please return to the emergency department for any further seizure-like activity or any other symptom personally concerning to yourself.

## 2021-05-04 ENCOUNTER — Emergency Department
Admission: EM | Admit: 2021-05-04 | Discharge: 2021-05-04 | Disposition: A | Discharge status: Home / Self Care | Payer: Medicaid Other | Attending: Emergency Medicine | Admitting: Emergency Medicine

## 2021-05-04 ENCOUNTER — Other Ambulatory Visit: Payer: Self-pay

## 2021-05-04 ENCOUNTER — Encounter: Payer: Self-pay | Admitting: Intensive Care

## 2021-05-04 DIAGNOSIS — R0981 Nasal congestion: Secondary | ICD-10-CM | POA: Diagnosis present

## 2021-05-04 DIAGNOSIS — Z20822 Contact with and (suspected) exposure to covid-19: Secondary | ICD-10-CM | POA: Insufficient documentation

## 2021-05-04 DIAGNOSIS — F17210 Nicotine dependence, cigarettes, uncomplicated: Secondary | ICD-10-CM | POA: Diagnosis not present

## 2021-05-04 DIAGNOSIS — J069 Acute upper respiratory infection, unspecified: Secondary | ICD-10-CM | POA: Diagnosis not present

## 2021-05-04 LAB — RESP PANEL BY RT-PCR (FLU A&B, COVID) ARPGX2
Influenza A by PCR: NEGATIVE
Influenza B by PCR: NEGATIVE
SARS Coronavirus 2 by RT PCR: NEGATIVE
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
## 2021-05-04 NOTE — ED Provider Notes (Signed)
  Emergency Medicine Provider Triage Evaluation Note  Candace Barnes , a 30 y.o.female,  was evaluated in triage.  Pt complains of fever, sinus congestion for 1 day.  She presents with her daughter who is also experiencing the same symptoms for the past week.  Denies chest pain, shortness of breath, or nausea/vomiting.   Review of Systems  Positive: Fever, sinus congestion, sore throat Negative: Denies fever, chest pain, vomiting  Physical Exam  There were no vitals filed for this visit. Gen:   Awake, no distress   Resp:  Normal effort  MSK:   Moves extremities without difficulty  Other:    Medical Decision Making  Given the patient's initial medical screening exam, the following diagnostic evaluation has been ordered. The patient will be placed in the appropriate treatment space, once one is available, to complete the evaluation and treatment. I have discussed the plan of care with the patient and I have advised the patient that an ED physician or mid-level practitioner will reevaluate their condition after the test results have been received, as the results may give them additional insight into the type of treatment they may need.    Diagnostics: Respiratory panel  Treatments: none immediately   Teodoro Spray, PA 05/04/21 1258    Vladimir Crofts, MD 05/04/21 1310

## 2021-05-04 NOTE — ED Triage Notes (Signed)
Mom c/o congestion and fever X1 day. Patients daughter has been sick this past week with same symptoms

## 2021-06-23 ENCOUNTER — Other Ambulatory Visit: Payer: Self-pay

## 2021-06-23 DIAGNOSIS — N898 Other specified noninflammatory disorders of vagina: Secondary | ICD-10-CM | POA: Diagnosis not present

## 2021-06-23 DIAGNOSIS — R82998 Other abnormal findings in urine: Secondary | ICD-10-CM | POA: Diagnosis not present

## 2021-06-23 NOTE — ED Triage Notes (Signed)
Pt presents via POV c/o uterine prolapse. Reports symptoms started yesterday.

## 2021-06-24 ENCOUNTER — Emergency Department: Payer: Medicaid Other

## 2021-06-24 ENCOUNTER — Emergency Department
Admission: EM | Admit: 2021-06-24 | Discharge: 2021-06-24 | Disposition: A | Discharge status: Home / Self Care | Payer: Medicaid Other | Attending: Emergency Medicine | Admitting: Emergency Medicine

## 2021-06-24 DIAGNOSIS — N361 Urethral diverticulum: Secondary | ICD-10-CM

## 2021-06-24 DIAGNOSIS — N898 Other specified noninflammatory disorders of vagina: Secondary | ICD-10-CM

## 2021-06-24 LAB — CBC WITH DIFFERENTIAL/PLATELET
Abs Immature Granulocytes: 0.03 10*3/uL (ref 0.00–0.07)
Basophils Absolute: 0 10*3/uL (ref 0.0–0.1)
Basophils Relative: 0 %
Eosinophils Absolute: 0.1 10*3/uL (ref 0.0–0.5)
Eosinophils Relative: 1 %
HCT: 34.4 % — ABNORMAL LOW (ref 36.0–46.0)
Hemoglobin: 10.9 g/dL — ABNORMAL LOW (ref 12.0–15.0)
Immature Granulocytes: 0 %
Lymphocytes Relative: 33 %
Lymphs Abs: 2.5 10*3/uL (ref 0.7–4.0)
MCH: 21.2 pg — ABNORMAL LOW (ref 26.0–34.0)
MCHC: 31.7 g/dL (ref 30.0–36.0)
MCV: 67.1 fL — ABNORMAL LOW (ref 80.0–100.0)
Monocytes Absolute: 0.5 10*3/uL (ref 0.1–1.0)
Monocytes Relative: 6 %
Neutro Abs: 4.5 10*3/uL (ref 1.7–7.7)
Neutrophils Relative %: 60 %
Platelets: 244 10*3/uL (ref 150–400)
RBC: 5.13 MIL/uL — ABNORMAL HIGH (ref 3.87–5.11)
RDW: 19.2 % — ABNORMAL HIGH (ref 11.5–15.5)
Smear Review: NORMAL
WBC: 7.7 10*3/uL (ref 4.0–10.5)
nRBC: 0 % (ref 0.0–0.2)

## 2021-06-24 LAB — BASIC METABOLIC PANEL
Anion gap: 4 — ABNORMAL LOW (ref 5–15)
BUN: 17 mg/dL (ref 6–20)
CO2: 27 mmol/L (ref 22–32)
Calcium: 8.6 mg/dL — ABNORMAL LOW (ref 8.9–10.3)
Chloride: 104 mmol/L (ref 98–111)
Creatinine, Ser: 0.85 mg/dL (ref 0.44–1.00)
GFR, Estimated: >60 mL/min (ref >60)
Glucose, Bld: 91 mg/dL (ref 70–99)
Potassium: 3.8 mmol/L (ref 3.5–5.1)
Sodium: 135 mmol/L (ref 135–145)

## 2021-06-24 LAB — URINALYSIS, ROUTINE W REFLEX MICROSCOPIC
Bacteria, UA: NONE SEEN
RBC / HPF: >50 RBC/hpf — ABNORMAL HIGH (ref 0–5)
Specific Gravity, Urine: 1.03 (ref 1.005–1.030)
WBC, UA: >50 WBC/hpf — ABNORMAL HIGH (ref 0–5)

## 2021-06-24 LAB — POC URINE PREG, ED: Preg Test, Ur: NEGATIVE

## 2021-06-24 MED ORDER — SULFAMETHOXAZOLE-TRIMETHOPRIM 800-160 MG PO TABS: 1.0000 | ORAL_TABLET | Freq: Two times a day (BID) | ORAL | 0 refills | Status: AC

## 2021-06-24 MED ORDER — IOHEXOL 300 MG/ML  SOLN
100.0000 mL | Freq: Once | INTRAMUSCULAR | Status: AC | PRN
  Administered 2021-06-24: 100 mL via INTRAVENOUS

## 2021-06-24 MED ORDER — HYDROMORPHONE HCL 1 MG/ML IJ SOLN
0.5000 mg | Freq: Once | INTRAMUSCULAR | Status: AC
Start: 2021-06-24 — End: 2021-06-24
  Administered 2021-06-24: 0.5 mg via INTRAVENOUS
  Filled 2021-06-24: qty 1

## 2021-06-24 MED ORDER — LORAZEPAM 2 MG/ML IJ SOLN
0.5000 mg | Freq: Once | INTRAMUSCULAR | Status: AC
  Administered 2021-06-24: 0.5 mg via INTRAVENOUS
  Filled 2021-06-24: qty 1

## 2021-06-24 MED ORDER — HYDROCODONE-ACETAMINOPHEN 5-325 MG PO TABS: 1.0000 | ORAL_TABLET | ORAL | 0 refills | Status: DC | PRN

## 2021-06-24 MED ORDER — GADOBUTROL 1 MMOL/ML IV SOLN
5.0000 mL | Freq: Once | INTRAVENOUS | Status: AC | PRN
  Administered 2021-06-24: 5 mL via INTRAVENOUS

## 2021-06-24 NOTE — ED Notes (Signed)
Patient to CT.

## 2021-06-24 NOTE — ED Notes (Signed)
Dr. Jari Pigg at the bedside to obtain photos of vaginal prolapse to be placed in pt's chart. Approx 3-4 packs of sugar were sprinkled over the prolapse.

## 2021-06-24 NOTE — ED Notes (Signed)
Dr. Jari Pigg at the bedside to evaluate patient. Vaginal exam performed with E. Minta Balsam, RN assisting. No specimens were ordered to be collected.

## 2021-06-24 NOTE — ED Notes (Addendum)
Holding Ativan until closer to time for patient to go to MRI. Patient and ED provider are aware

## 2021-06-24 NOTE — ED Notes (Addendum)
Dr. Jari Pigg at the bedside to try to push prolapsed uterus back into place. Pain medication given. Pt did not tolerate well.

## 2021-06-24 NOTE — ED Notes (Addendum)
Dr. Jari Pigg and Dr. Karma Greaser at the bedside to examine patient.

## 2021-06-24 NOTE — ED Notes (Signed)
ED Provider at bedside. 

## 2021-06-24 NOTE — Discharge Instructions (Addendum)
You may also use ibuprofen up to 600 mg every 8 hours, naproxen up to 500 mg every 12 hours, and/or Tylenol up to 4 grams in 24 hours for any continued pain

## 2021-06-24 NOTE — ED Provider Notes (Signed)
[facility] Provider Note    Event Date/Time   First MD Initiated Contact with Patient 06/24/21 0116     (approximate)   History   Vaginal Prolapse   HPI  Candace Barnes is a 31 y.o. female with history of seizures who comes in with concern for uterine prolapse.  Patient reports that yesterday she has noticed a bulge coming out of her vagina.  She denies this ever happening previously.  She reports 1 prior vaginal delivery but that was 3 to 4 years ago.  She denies any issues with urination.  She is currently menstruating so denies concern for pregnancy.  No abdominal pain or other concerns  Triage Vital Signs: ED Triage Vitals [06/23/21 2231]  Enc Vitals Group     BP 127/81     Pulse Rate 75     Resp 14     Temp (!) 97.5 F (36.4 C)     Temp Source Oral     SpO2 98 %     Weight      Height      Head Circumference      Peak Flow      Pain Score 10     Pain Loc      Pain Edu?      Excl. in [place]?     Most recent vital signs: Vitals:   06/23/21 2231  BP: 127/81  Pulse: 75  Resp: 14  Temp: (!) 97.5 F (36.4 C)  SpO2: 98%     General: Awake, no distress.  CV:  Good peripheral perfusion.  Resp:  Normal effort.  Abd:  No distention.  Other:  GU exam with mass noted in the vagina midline.  Does not seem to be along the wall but may be a little bit more to the right seems to be potentially connected with the urethra.  See picture in media tab   ED Results / Procedures / Treatments   Labs (all labs ordered are listed, but only abnormal results are displayed) Labs Reviewed  CBC WITH DIFFERENTIAL/PLATELET - Abnormal; Notable for the following components:      Result Value   RBC 5.13 (*)    Hemoglobin 10.9 (*)    HCT 34.4 (*)    MCV 67.1 (*)    MCH 21.2 (*)    RDW 19.2 (*)    All other components within normal limits  BASIC METABOLIC PANEL - Abnormal; Notable for the following components:   Calcium 8.6 (*)    Anion gap 4 (*)    All  other components within normal limits  URINALYSIS, ROUTINE W REFLEX MICROSCOPIC - Abnormal; Notable for the following components:   Color, Urine RED (*)    APPearance TURBID (*)    Glucose, UA   (*)    Value: TEST NOT REPORTED DUE TO COLOR INTERFERENCE OF URINE PIGMENT   Hgb urine dipstick   (*)    Value: TEST NOT REPORTED DUE TO COLOR INTERFERENCE OF URINE PIGMENT   Bilirubin Urine   (*)    Value: TEST NOT REPORTED DUE TO COLOR INTERFERENCE OF URINE PIGMENT   Ketones, ur   (*)    Value: TEST NOT REPORTED DUE TO COLOR INTERFERENCE OF URINE PIGMENT   Protein, ur   (*)    Value: TEST NOT REPORTED DUE TO COLOR INTERFERENCE OF URINE PIGMENT   Nitrite   (*)    Value: TEST NOT REPORTED DUE TO COLOR INTERFERENCE OF URINE PIGMENT  Leukocytes,Ua   (*)    Value: TEST NOT REPORTED DUE TO COLOR INTERFERENCE OF URINE PIGMENT   RBC / HPF >50 (*)    WBC, UA >50 (*)    All other components within normal limits  POC URINE PREG, ED     RADIOLOGY I have reviewed the xray personally and agree with radiology read.  Radiology read is CT scan concerning for midline structure that could be urethral diverticulum with secondary inflammation or infection, recommend MRI of the pelvis to further characterize   PROCEDURES:  Critical Care performed: No  Procedures   MEDICATIONS ORDERED IN ED: Medications  HYDROmorphone (DILAUDID) injection 0.5 mg (0.5 mg Intravenous Given 06/24/21 0210)  HYDROmorphone (DILAUDID) injection 0.5 mg (0.5 mg Intravenous Given 06/24/21 0330)  iohexol (OMNIPAQUE) 300 MG/ML solution 100 mL (100 mLs Intravenous Contrast Given 06/24/21 0451)     IMPRESSION / MDM / ASSESSMENT AND PLAN / ED COURSE  I reviewed the triage vital signs and the nursing notes.  Patient with history of vaginal birth who comes in for concern for bump in her vagina Differential diagnosis includes, but is not limited to, prolapse, abscess, urethral diverticulum.  Discussed with Dr. Amalia Hailey from OB/GYN who  recommended trying to reduce it.  I did place some sugar on it and try to reduce with some Dilaudid.  Crazy test was negative.  Urine has significant mount of RBCs and WBCs but patient is currently menstruating.  We will send for urine culture CBC shows stable hemoglobin and unlikely anemia.  CMP shows no evidence of kidney dysfunction  3:39 AM after further evaluation it appears like the mass is sitting near her urethra.  Does not seem to communicate with her vagina but it is rather above it.  Given unclear exactly the origin of it though I feel like we should get CT imaging to further characterize.  I attempted reduction but was unsuccessful.  Patient is having a lot of pain I wonder if there could be an abscess or something else.   CT imaging difficult to appreciate exactly where it is and recommend MRI.  We will get MRI.  Patient be handed off to oncoming team pending MRI and further work-up.   FINAL CLINICAL IMPRESSION(S) / ED DIAGNOSES   Final diagnoses:  Vaginal mass     Rx / DC Orders   ED Discharge Orders     None        Note:  This document was prepared using Dragon voice recognition software and may include unintentional dictation errors.   Vanessa Beebe, MD 06/24/21 [phone]

## 2021-06-25 ENCOUNTER — Ambulatory Visit: Payer: Medicaid Other | Admitting: Obstetrics and Gynecology

## 2021-06-26 LAB — URINE CULTURE: Culture: >=100000 — AB

## 2021-06-28 ENCOUNTER — Ambulatory Visit: Payer: Self-pay | Admitting: Urology

## 2021-07-08 ENCOUNTER — Ambulatory Visit: Payer: Self-pay | Admitting: Urology

## 2021-08-09 ENCOUNTER — Encounter: Payer: Self-pay | Admitting: Emergency Medicine

## 2021-08-09 ENCOUNTER — Emergency Department
Admission: EM | Admit: 2021-08-09 | Discharge: 2021-08-09 | Disposition: A | Discharge status: Home / Self Care | Payer: Medicaid Other | Attending: Emergency Medicine | Admitting: Emergency Medicine

## 2021-08-09 ENCOUNTER — Other Ambulatory Visit: Payer: Self-pay

## 2021-08-09 ENCOUNTER — Emergency Department: Payer: Medicaid Other

## 2021-08-09 DIAGNOSIS — Z8616 Personal history of COVID-19: Secondary | ICD-10-CM | POA: Insufficient documentation

## 2021-08-09 DIAGNOSIS — F17200 Nicotine dependence, unspecified, uncomplicated: Secondary | ICD-10-CM | POA: Insufficient documentation

## 2021-08-09 DIAGNOSIS — Z20822 Contact with and (suspected) exposure to covid-19: Secondary | ICD-10-CM | POA: Diagnosis not present

## 2021-08-09 DIAGNOSIS — R509 Fever, unspecified: Secondary | ICD-10-CM | POA: Diagnosis present

## 2021-08-09 DIAGNOSIS — J039 Acute tonsillitis, unspecified: Secondary | ICD-10-CM

## 2021-08-09 DIAGNOSIS — N9489 Other specified conditions associated with female genital organs and menstrual cycle: Secondary | ICD-10-CM | POA: Diagnosis not present

## 2021-08-09 DIAGNOSIS — R079 Chest pain, unspecified: Secondary | ICD-10-CM | POA: Insufficient documentation

## 2021-08-09 DIAGNOSIS — R000 Tachycardia, unspecified: Secondary | ICD-10-CM | POA: Diagnosis not present

## 2021-08-09 DIAGNOSIS — R42 Dizziness and giddiness: Secondary | ICD-10-CM | POA: Insufficient documentation

## 2021-08-09 LAB — CBC WITH DIFFERENTIAL/PLATELET
Abs Immature Granulocytes: 0.01 10*3/uL (ref 0.00–0.07)
Basophils Absolute: 0 10*3/uL (ref 0.0–0.1)
Basophils Relative: 0 %
Eosinophils Absolute: 0 10*3/uL (ref 0.0–0.5)
Eosinophils Relative: 0 %
HCT: 41.3 % (ref 36.0–46.0)
Hemoglobin: 12.6 g/dL (ref 12.0–15.0)
Immature Granulocytes: 0 %
Lymphocytes Relative: 28 %
Lymphs Abs: 1.6 10*3/uL (ref 0.7–4.0)
MCH: 20.1 pg — ABNORMAL LOW (ref 26.0–34.0)
MCHC: 30.5 g/dL (ref 30.0–36.0)
MCV: 65.8 fL — ABNORMAL LOW (ref 80.0–100.0)
Monocytes Absolute: 0.5 10*3/uL (ref 0.1–1.0)
Monocytes Relative: 9 %
Neutro Abs: 3.6 10*3/uL (ref 1.7–7.7)
Neutrophils Relative %: 63 %
Platelets: 206 10*3/uL (ref 150–400)
RBC: 6.28 MIL/uL — ABNORMAL HIGH (ref 3.87–5.11)
RDW: 20.2 % — ABNORMAL HIGH (ref 11.5–15.5)
Smear Review: NORMAL
WBC: 5.7 10*3/uL (ref 4.0–10.5)
nRBC: 0 % (ref 0.0–0.2)

## 2021-08-09 LAB — RESP PANEL BY RT-PCR (FLU A&B, COVID) ARPGX2
Influenza A by PCR: NEGATIVE
Influenza B by PCR: NEGATIVE
SARS Coronavirus 2 by RT PCR: NEGATIVE

## 2021-08-09 LAB — BASIC METABOLIC PANEL
Anion gap: 6 (ref 5–15)
BUN: 12 mg/dL (ref 6–20)
CO2: 27 mmol/L (ref 22–32)
Calcium: 8.9 mg/dL (ref 8.9–10.3)
Chloride: 99 mmol/L (ref 98–111)
Creatinine, Ser: 0.95 mg/dL (ref 0.44–1.00)
GFR, Estimated: >60 mL/min (ref >60)
Glucose, Bld: 94 mg/dL (ref 70–99)
Potassium: 3.9 mmol/L (ref 3.5–5.1)
Sodium: 132 mmol/L — ABNORMAL LOW (ref 135–145)

## 2021-08-09 LAB — GROUP A STREP BY PCR: Group A Strep by PCR: NOT DETECTED

## 2021-08-09 LAB — TROPONIN I (HIGH SENSITIVITY): Troponin I (High Sensitivity): <2 ng/L (ref <18)

## 2021-08-09 LAB — D-DIMER, QUANTITATIVE: D-Dimer, Quant: 0.36 ug/mL-FEU (ref 0.00–0.50)

## 2021-08-09 LAB — HCG, QUANTITATIVE, PREGNANCY: hCG, Beta Chain, Quant, S: <1 m[IU]/mL (ref <5)

## 2021-08-09 MED ORDER — IBUPROFEN 400 MG PO TABS
400.0000 mg | ORAL_TABLET | Freq: Once | ORAL | Status: AC
  Administered 2021-08-09: 400 mg via ORAL
  Filled 2021-08-09: qty 1

## 2021-08-09 MED ORDER — AMOXICILLIN-POT CLAVULANATE 875-125 MG PO TABS
1.0000 | ORAL_TABLET | Freq: Once | ORAL | Status: AC
  Administered 2021-08-09: 1 via ORAL
  Filled 2021-08-09: qty 1

## 2021-08-09 MED ORDER — LACTATED RINGERS IV BOLUS
1000.0000 mL | Freq: Once | INTRAVENOUS | Status: AC
  Administered 2021-08-09: 1000 mL via INTRAVENOUS

## 2021-08-09 MED ORDER — ACETAMINOPHEN 500 MG PO TABS
1000.0000 mg | ORAL_TABLET | Freq: Once | ORAL | Status: AC
Start: 2021-08-09 — End: 2021-08-09
  Administered 2021-08-09: 1000 mg via ORAL
  Filled 2021-08-09: qty 2

## 2021-08-09 MED ORDER — AMOXICILLIN-POT CLAVULANATE 875-125 MG PO TABS: 1.0000 | ORAL_TABLET | Freq: Two times a day (BID) | ORAL | 0 refills | Status: AC

## 2021-08-09 NOTE — ED Triage Notes (Signed)
Pt reports that she has had a fever, generalized body aches and sore throat for the last three days. She has COVID last year and has had the same symptoms. She reports that her sister in law has same symptoms and they live in the same house. ?

## 2021-08-09 NOTE — ED Provider Notes (Signed)
? ?[facility] ?Provider Note ? ? ? Event Date/Time  ? First MD Initiated Contact with Patient 08/09/21 1500   ?  (approximate) ? ? ?History  ? ?Fever, Generalized Body Aches, and Sore Throat ? ? ?HPI ? ?Candace Barnes is a 31 y.o. female with a past medical history of pseudoseizures, migraine headaches and tobacco abuse who presents for evaluation of 3 to 4 days of subjective fevers, body aches, chest pain, shortness of breath, decreased appetite and some dizziness.  She has been taking TheraFlu but reports this does not help much.  She states she thinks it feels she has COVID because it is very similar feeling to when she had COVID last year.  She denies any cough but states she does have a sore throat.  No earache, headache, abdominal pain, back pain, urinary symptoms but has been feeling a little constipated.  No rash or focal extremity weakness numbness or tingling.  No other acute concerns at this time. ? ?  ? ? ?Physical Exam  ?Triage Vital Signs: ?ED Triage Vitals  ?Enc Vitals Group  ?   BP 08/09/21 1340 (!) 124/93  ?   Pulse Rate 08/09/21 1340 (!) 114  ?   Resp 08/09/21 1340 17  ?   Temp 08/09/21 1340 100.1 ?F (37.8 ?C)  ?   Temp Source 08/09/21 1340 Oral  ?   SpO2 08/09/21 1340 97 %  ?   Weight 08/09/21 1340 130 lb (59 kg)  ?   Height 08/09/21 1340 '6\' 1"'$  (1.854 m)  ?   Head Circumference --   ?   Peak Flow --   ?   Pain Score 08/09/21 1344 10  ?   Pain Loc --   ?   Pain Edu? --   ?   Excl. in [place]? --   ? ? ?Most recent vital signs: ?Vitals:  ? 08/09/21 1340  ?BP: (!) 124/93  ?Pulse: (!) 114  ?Resp: 17  ?Temp: 100.1 ?F (37.8 ?C)  ?SpO2: 97%  ? ? ?General: Awake, no distress.  ?CV:  Good peripheral perfusion.  2+ radial pulses.  No significant murmur. ?Resp:  Normal effort.  Clear bilaterally. ?Abd:  No distention.  Soft throughout. ?Other:  Bilateral tonsil enlargement with exudates.  There are some anterior cervical lymphadenopathy.  No hoarseness or stridor.  No uvular deviation.   Patient has full range of motion of her neck. ? ? ?ED Results / Procedures / Treatments  ?Labs ?(all labs ordered are listed, but only abnormal results are displayed) ?Labs Reviewed  ?CBC WITH DIFFERENTIAL/PLATELET - Abnormal; Notable for the following components:  ?    Result Value  ? RBC 6.28 (*)   ? MCV 65.8 (*)   ? MCH 20.1 (*)   ? RDW 20.2 (*)   ? All other components within normal limits  ?BASIC METABOLIC PANEL - Abnormal; Notable for the following components:  ? Sodium 132 (*)   ? All other components within normal limits  ?RESP PANEL BY RT-PCR (FLU A&B, COVID) ARPGX2  ?GROUP A STREP BY PCR  ?HCG, QUANTITATIVE, PREGNANCY  ?D-DIMER, QUANTITATIVE  ?TROPONIN I (HIGH SENSITIVITY)  ? ? ? ?EKG ? ?EKG shows sinus rhythm with a ventricular rate of 73, normal axis, unremarkable intervals without evidence of acute ischemia or significant arrhythmia. ? ? ?RADIOLOGY ?Chest reviewed by myself shows no focal consoidation, effusion, edema, pneumothorax or other clear acute thoracic process. I also reviewed radiology interpretation and agree with findings described. ? ? ? ?  PROCEDURES: ? ?Critical Care performed: No ? ?Procedures ? ? ? ?MEDICATIONS ORDERED IN ED: ?Medications  ?ibuprofen (ADVIL) tablet 400 mg (has no administration in time range)  ?acetaminophen (TYLENOL) tablet 1,000 mg (1,000 mg Oral Given 08/09/21 1533)  ?lactated ringers bolus 1,000 mL (1,000 mLs Intravenous New Bag/Given 08/09/21 1533)  ?amoxicillin-clavulanate (AUGMENTIN) 875-125 MG per tablet 1 tablet (1 tablet Oral Given 08/09/21 1533)  ? ? ? ?IMPRESSION / MDM / ASSESSMENT AND PLAN / ED COURSE  ?I reviewed the triage vital signs and the nursing notes. ?             ?               ? ?Differential diagnosis includes, but is not limited to viral URI versus bacterial tonsillitis, bacterial pneumonia, myocarditis, PE, and possible dehydration causing metabolic derangements and kidney injury. ? ?EKG shows sinus rhythm with a ventricular rate of 73, normal axis,  unremarkable intervals without evidence of acute ischemia or significant arrhythmia.  Undetectable troponin is not suggestive of ACS or myocarditis. ? ?Chest reviewed by myself shows no focal consoidation, effusion, edema, pneumothorax or other clear acute thoracic process. I also reviewed radiology interpretation and agree with findings described. ? ?Rapid strep screen is negative.  COVID influenza PCR is negative.  D-dimer is 0.36 and thus I have a low suspicion at this time for PE.  CBC with WBC count of 5.7 without evidence of acute anemia and normal platelets.  Patient initially had slight tachycardia and a temperature of 100.1 at this point I do not believe she is septic.  BMP without any significant electrolyte or metabolic derangements. ? ?At this point given stable vitals with otherwise reassuring exam work-up and no evidence of respiratory distress or patient having any trismus or difficulty tolerating secretions and low suspicion for sepsis I think she is stable for discharge with outpatient follow-up.  Will write Rx for Augmentin for concern for any bacterial tonsillitis.  I suspect there is a component of possibly early developing bronchitis or pleurisy with this.  Counseled on tobacco cessation.  Discussed returning immediately for any new or worsening of symptoms.  Discharged in stable condition. ? ?  ? ? ?FINAL CLINICAL IMPRESSION(S) / ED DIAGNOSES  ? ?Final diagnoses:  ?Tonsillitis  ?Chest pain, unspecified type  ? ? ? ?Rx / DC Orders  ? ?ED Discharge Orders   ? ?      Ordered  ?  amoxicillin-clavulanate (AUGMENTIN) 875-125 MG tablet  2 times daily       ? 08/09/21 1620  ? ?  ?  ? ?  ? ? ? ?Note:  This document was prepared using Dragon voice recognition software and may include unintentional dictation errors. ?  ?Lucrezia Starch, MD ?08/09/21 1621 ? ?

## 2021-08-09 NOTE — ED Provider Triage Note (Signed)
Emergency Medicine Provider Triage Evaluation Note ? ?Candace Barnes , a 31 y.o. female  was evaluated in triage.  Pt complains of sore throat, fever, bodyaches x 3 days. Family members with same symptoms. ? ?Review of Systems  ?Positive: Sore throat, fever ?Negative: Nausea, vomiting, diarrhea ? ?Physical Exam  ?BP (!) 124/93 (BP Location: Left Arm)   Pulse (!) 114   Temp 100.1 ?F (37.8 ?C) (Oral)   Resp 17   Ht '6\' 1"'$  (1.854 m)   Wt 59 kg   SpO2 97%   BMI 17.15 kg/m?  ?Gen:   Awake, no distress   ?Resp:  Normal effort  ?MSK:   Moves extremities without difficulty  ?Other:   ? ?Medical Decision Making  ?Medically screening exam initiated at 1:45 PM.  Appropriate orders placed.  Caroljean Kearley was informed that the remainder of the evaluation will be completed by another provider, this initial triage assessment does not replace that evaluation, and the importance of remaining in the ED until their evaluation is complete. ?  ?Victorino Dike, FNP ?08/09/21 1346 ? ?

## 2021-08-18 ENCOUNTER — Ambulatory Visit: Payer: Medicaid Other | Admitting: Obstetrics and Gynecology
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
## 2021-08-30 ENCOUNTER — Emergency Department
Admission: EM | Admit: 2021-08-30 | Discharge: 2021-08-30 | Disposition: A | Discharge status: Home / Self Care | Payer: Medicaid Other | Attending: Emergency Medicine | Admitting: Emergency Medicine

## 2021-08-30 ENCOUNTER — Other Ambulatory Visit: Payer: Self-pay

## 2021-08-30 DIAGNOSIS — B9689 Other specified bacterial agents as the cause of diseases classified elsewhere: Secondary | ICD-10-CM | POA: Diagnosis not present

## 2021-08-30 DIAGNOSIS — N760 Acute vaginitis: Secondary | ICD-10-CM | POA: Insufficient documentation

## 2021-08-30 DIAGNOSIS — N898 Other specified noninflammatory disorders of vagina: Secondary | ICD-10-CM | POA: Diagnosis present

## 2021-08-30 LAB — CBC WITH DIFFERENTIAL/PLATELET
Abs Immature Granulocytes: 0.01 10*3/uL (ref 0.00–0.07)
Basophils Absolute: 0 10*3/uL (ref 0.0–0.1)
Basophils Relative: 0 %
Eosinophils Absolute: 0.1 10*3/uL (ref 0.0–0.5)
Eosinophils Relative: 1 %
HCT: 35.5 % — ABNORMAL LOW (ref 36.0–46.0)
Hemoglobin: 10.7 g/dL — ABNORMAL LOW (ref 12.0–15.0)
Immature Granulocytes: 0 %
Lymphocytes Relative: 32 %
Lymphs Abs: 1.6 10*3/uL (ref 0.7–4.0)
MCH: 20.2 pg — ABNORMAL LOW (ref 26.0–34.0)
MCHC: 30.1 g/dL (ref 30.0–36.0)
MCV: 67.1 fL — ABNORMAL LOW (ref 80.0–100.0)
Monocytes Absolute: 0.3 10*3/uL (ref 0.1–1.0)
Monocytes Relative: 6 %
Neutro Abs: 3.2 10*3/uL (ref 1.7–7.7)
Neutrophils Relative %: 61 %
Platelets: 229 10*3/uL (ref 150–400)
RBC: 5.29 MIL/uL — ABNORMAL HIGH (ref 3.87–5.11)
RDW: 20.1 % — ABNORMAL HIGH (ref 11.5–15.5)
Smear Review: NORMAL
WBC: 5.2 10*3/uL (ref 4.0–10.5)
nRBC: 0 % (ref 0.0–0.2)

## 2021-08-30 LAB — URINALYSIS, ROUTINE W REFLEX MICROSCOPIC
Bacteria, UA: NONE SEEN
Bilirubin Urine: NEGATIVE
Glucose, UA: NEGATIVE mg/dL
Hgb urine dipstick: NEGATIVE
Ketones, ur: NEGATIVE mg/dL
Nitrite: NEGATIVE
Protein, ur: NEGATIVE mg/dL
Specific Gravity, Urine: 1.02 (ref 1.005–1.030)
pH: 5 (ref 5.0–8.0)

## 2021-08-30 LAB — COMPREHENSIVE METABOLIC PANEL
ALT: 17 U/L (ref 0–44)
AST: 17 U/L (ref 15–41)
Albumin: 3.8 g/dL (ref 3.5–5.0)
Alkaline Phosphatase: 45 U/L (ref 38–126)
Anion gap: 6 (ref 5–15)
BUN: 11 mg/dL (ref 6–20)
CO2: 27 mmol/L (ref 22–32)
Calcium: 8.7 mg/dL — ABNORMAL LOW (ref 8.9–10.3)
Chloride: 103 mmol/L (ref 98–111)
Creatinine, Ser: 0.81 mg/dL (ref 0.44–1.00)
GFR, Estimated: >60 mL/min (ref >60)
Glucose, Bld: 86 mg/dL (ref 70–99)
Potassium: 4 mmol/L (ref 3.5–5.1)
Sodium: 136 mmol/L (ref 135–145)
Total Bilirubin: 2.1 mg/dL — ABNORMAL HIGH (ref 0.3–1.2)
Total Protein: 7.7 g/dL (ref 6.5–8.1)

## 2021-08-30 LAB — WET PREP, GENITAL
Sperm: NONE SEEN
Trich, Wet Prep: NONE SEEN
WBC, Wet Prep HPF POC: >=10 — AB (ref <10)
Yeast Wet Prep HPF POC: NONE SEEN

## 2021-08-30 LAB — POC URINE PREG, ED: Preg Test, Ur: NEGATIVE

## 2021-08-30 MED ORDER — METRONIDAZOLE 500 MG PO TABS: 500.0000 mg | ORAL_TABLET | Freq: Two times a day (BID) | ORAL | 0 refills | Status: DC

## 2021-08-30 NOTE — Discharge Instructions (Addendum)
Begin taking antibiotics until completely finished.  This is twice a day for 7 days.  Do not drink alcohol or coughs or containing alcohol as this would make you extremely sick.  Call make an appointment with Dr. Glennon Mac who is the gynecologist on-call.  Your prolapse will need to be surgically repaired. ?

## 2021-08-30 NOTE — ED Triage Notes (Signed)
Patient to ER via POV with complaints of vaginal prolapse that happened yesterday, reports it could either be her bladder or uterus. Reports she can feel it dropped. Also reports some vaginal discharge.  ? ?States this happened a month ago and was placed on antibiotics.  ?

## 2021-08-30 NOTE — ED Provider Notes (Signed)
? ?[facility] ?Provider Note ? ? ? Event Date/Time  ? First MD Initiated Contact with Patient 08/30/21 1032   ?  (approximate) ? ? ?History  ? ?Vaginal Prolapse ? ? ?HPI ? ?Candace Barnes is a 31 y.o. female   presents to the ED with complaints of vaginal prolapse that happened again yesterday.  Patient has been seen in the emergency department previously where this was noted on CT scan.  Patient has not followed up with OB/GYN at this point to have this surgically repaired.  Patient reports a vaginal discharge.  She denies any dysuria or frequency and is able to urinate without any problems.  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  Patient also has a history of depression, migraines and history of seizures.  She rates her discomfort as 7 out of 10. ? ?  ? ? ?Physical Exam  ? ?Triage Vital Signs: ?ED Triage Vitals  ?Enc Vitals Group  ?   BP 08/30/21 1026 113/74  ?   Pulse Rate 08/30/21 1026 60  ?   Resp 08/30/21 1026 18  ?   Temp 08/30/21 1026 99 ?F (37.2 ?C)  ?   Temp Source 08/30/21 1026 Oral  ?   SpO2 08/30/21 1026 100 %  ?   Weight 08/30/21 1044 129 lb 13.6 oz (58.9 kg)  ?   Height 08/30/21 1027 '6\' 1"'$  (1.854 m)  ?   Head Circumference --   ?   Peak Flow --   ?   Pain Score 08/30/21 1026 7  ?   Pain Loc --   ?   Pain Edu? --   ?   Excl. in [place]? --   ? ? ?Most recent vital signs: ?Vitals:  ? 08/30/21 1026 08/30/21 1200  ?BP: 113/74 118/70  ?Pulse: 60 68  ?Resp: 18 18  ?Temp: 99 ?F (37.2 ?C) 98.4 ?F (36.9 ?C)  ?SpO2: 100% 100%  ? ? ? ?General: Awake, no distress.  ?CV:  Good peripheral perfusion.  ?Resp:  Normal effort.  ?Abd:  No distention.  ?Other:  Vaginal exam there is a small protrusion when compared with the previous picture that was scanned into her file on her last visit has not enlarged and actually looks slightly smaller than when she was here originally.  There is vaginal discharge noted.  This was swabbed and sent for wet prep. ? ? ?ED  Results / Procedures / Treatments  ? ?Labs ?(all labs ordered are listed, but only abnormal results are displayed) ?Labs Reviewed  ?WET PREP, GENITAL - Abnormal; Notable for the following components:  ?    Result Value  ? Clue Cells Wet Prep HPF POC PRESENT (*)   ? WBC, Wet Prep HPF POC >=10 (*)   ? All other components within normal limits  ?CBC WITH DIFFERENTIAL/PLATELET - Abnormal; Notable for the following components:  ? RBC 5.29 (*)   ? Hemoglobin 10.7 (*)   ? HCT 35.5 (*)   ? MCV 67.1 (*)   ? MCH 20.2 (*)   ? RDW 20.1 (*)   ? All other components within normal limits  ?COMPREHENSIVE METABOLIC PANEL - Abnormal; Notable for the following components:  ? Calcium 8.7 (*)   ? Total Bilirubin 2.1 (*)   ? All other components within normal limits  ?URINALYSIS, ROUTINE W REFLEX MICROSCOPIC - Abnormal; Notable for the following components:  ? Color, Urine YELLOW (*)   ? APPearance  CLEAR (*)   ? Leukocytes,Ua TRACE (*)   ? All other components within normal limits  ?POC URINE PREG, ED  ? ? ? ? ? ?PROCEDURES: ? ?Critical Care performed:  ? ?Procedures ? ? ?MEDICATIONS ORDERED IN ED: ?Medications - No data to display ? ? ?IMPRESSION / MDM / ASSESSMENT AND PLAN / ED COURSE  ?I reviewed the triage vital signs and the nursing notes. ? ? ?Differential diagnosis includes, but is not limited to, urinary tract infection, vaginitis, vaginal prolapse. ? ?31 year old female presents to the ED with complaint of vaginal discharge with no urinary symptoms.  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  Patient was to follow-up with OB/GYN but states that she missed her appointment.  Today her main concern is her vaginal discharge.  Area was complete paired to a picture that was scanned into her chart by Dr. Jari Pigg on her last ED visit and is no larger than when seen at that time.  Wet prep was obtained and results were positive for  clue cells.  Patient was made aware that most likely the bacterial vaginosis is what is causing her symptoms at this time.  A prescription for Flagyl 500 mg twice daily for 7 days was sent to her pharmacy and she is encouraged to follow-up with the gynecologist on her discharge papers. ? ? ? ?  ? ? ?FINAL CLINICAL IMPRESSION(S) / ED DIAGNOSES  ? ?Final diagnoses:  ?BV (bacterial vaginosis)  ? ? ? ?Rx / DC Orders  ? ?ED Discharge Orders   ? ?      Ordered  ?  metroNIDAZOLE (FLAGYL) 500 MG tablet  2 times daily       ? 08/30/21 1154  ? ?  ?  ? ?  ? ? ? ?Note:  This document was prepared using Dragon voice recognition software and may include unintentional dictation errors. ?  ?Johnn Hai, PA-C ?08/30/21 1202 ? ?  ?Lavonia Drafts, MD ?08/30/21 1237 ? ?

## 2021-08-30 NOTE — ED Triage Notes (Signed)
First Nurse Note:  C/O prolapsed bladder or uterus x 1 day.  Has had same in the past.  Denies dysuria. ?

## 2021-10-08 ENCOUNTER — Encounter (HOSPITAL_COMMUNITY): Payer: Self-pay

## 2021-10-08 ENCOUNTER — Emergency Department (HOSPITAL_COMMUNITY)
Admission: EM | Admit: 2021-10-08 | Discharge: 2021-10-08 | Disposition: A | Discharge status: Home / Self Care | Payer: Medicaid Other | Attending: Emergency Medicine | Admitting: Emergency Medicine

## 2021-10-08 ENCOUNTER — Other Ambulatory Visit: Payer: Self-pay

## 2021-10-08 DIAGNOSIS — Z91410 Personal history of adult physical and sexual abuse: Secondary | ICD-10-CM | POA: Insufficient documentation

## 2021-10-08 DIAGNOSIS — Z9189 Other specified personal risk factors, not elsewhere classified: Secondary | ICD-10-CM

## 2021-10-08 DIAGNOSIS — L089 Local infection of the skin and subcutaneous tissue, unspecified: Secondary | ICD-10-CM | POA: Diagnosis not present

## 2021-10-08 NOTE — ED Notes (Signed)
Pt ambulatory without assistance.  
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
## 2021-10-08 NOTE — ED Notes (Signed)
All belongings were secured in brown paper bags in accordance with evidence collection protocol. Items were individually bagged and labeled and secured by [employer] to be collected by [employer]. Pt Dc'd in paper scrubs. ?
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
## 2021-10-08 NOTE — Discharge Instructions (Signed)
Counseling resource list provided if needed. ?

## 2021-11-19 ENCOUNTER — Encounter: Payer: Self-pay | Admitting: Emergency Medicine

## 2021-11-19 ENCOUNTER — Emergency Department
Admission: EM | Admit: 2021-11-19 | Discharge: 2021-11-19 | Disposition: A | Discharge status: Home / Self Care | Payer: Medicaid Other | Attending: Emergency Medicine | Admitting: Emergency Medicine

## 2021-11-19 DIAGNOSIS — K029 Dental caries, unspecified: Secondary | ICD-10-CM | POA: Insufficient documentation

## 2021-11-19 DIAGNOSIS — K0889 Other specified disorders of teeth and supporting structures: Secondary | ICD-10-CM | POA: Diagnosis present

## 2021-11-19 MED ORDER — AMOXICILLIN 500 MG PO CAPS
1000.0000 mg | ORAL_CAPSULE | Freq: Once | ORAL | Status: AC
  Administered 2021-11-19: 1000 mg via ORAL
  Filled 2021-11-19: qty 2

## 2021-11-19 MED ORDER — IBUPROFEN 800 MG PO TABS
800.0000 mg | ORAL_TABLET | Freq: Once | ORAL | Status: AC
  Administered 2021-11-19: 800 mg via ORAL
  Filled 2021-11-19: qty 1

## 2021-11-19 MED ORDER — AMOXICILLIN 500 MG PO CAPS: 1000.0000 mg | ORAL_CAPSULE | Freq: Two times a day (BID) | ORAL | 0 refills | Status: AC

## 2021-11-19 MED ORDER — IBUPROFEN 800 MG PO TABS: 800.0000 mg | ORAL_TABLET | Freq: Three times a day (TID) | ORAL | 0 refills | Status: DC | PRN

## 2021-11-19 MED ORDER — OXYCODONE HCL 5 MG PO CAPS: 5.0000 mg | ORAL_CAPSULE | Freq: Four times a day (QID) | ORAL | 0 refills | Status: DC | PRN

## 2021-11-19 NOTE — ED Notes (Signed)
Pt discharge information reviewed. Pt understands need for follow up care and when to return if symptoms worsen. Medications discussed.  All questions answered. Pt is alert and oriented with even and regular respirations. Pt is seen ambulating out of department with string steady gait.

## 2021-11-19 NOTE — ED Triage Notes (Signed)
Pt presents via POV with complaints of dental pain on the left lower side of her jaw that started two days ago. Pt has mild swelling to that area of her mouth. No difficulty swallowing.
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
## 2021-11-19 NOTE — ED Notes (Signed)
ED Provider at bedside. 

## 2021-11-19 NOTE — Discharge Instructions (Addendum)
You may alternate Tylenol 1000 mg every 6 hours as needed for pain, fever and Ibuprofen 800 mg every 6-8 hours as needed for pain, fever.  Please take Ibuprofen with food.  Do not take more than 4000 mg of Tylenol (acetaminophen) in a 24 hour period. ° ° °You are being provided a prescription for opiates (also known as narcotics) for pain control.  Opiates can be addictive and should only be used when absolutely necessary for pain control when other alternatives do not work.  We recommend you only use them for the recommended amount of time and only as prescribed.  Please do not take with other sedative medications or alcohol.  Please do not drive, operate machinery, make important decisions while taking opiates.  Please note that these medications can be addictive and have high abuse potential.  Patients can become addicted to narcotics after only taking them for a few days.  Please keep these medications locked away from children, teenagers or any family members with history of substance abuse.  Narcotic pain medicine may also make you constipated.  You may use over-the-counter medications such as MiraLAX, Colace to prevent constipation.  If you become constipated, you may use over-the-counter enemas as needed.  Itching and nausea are also common side effects of narcotic pain medication.  If you develop uncontrolled vomiting or a rash, please stop these medications and seek medical care. ° °

## 2022-01-05 ENCOUNTER — Emergency Department
Admission: EM | Admit: 2022-01-05 | Discharge: 2022-01-05 | Disposition: A | Discharge status: Home / Self Care | Payer: Medicaid Other | Attending: Emergency Medicine | Admitting: Emergency Medicine

## 2022-01-05 ENCOUNTER — Emergency Department: Payer: Medicaid Other

## 2022-01-05 ENCOUNTER — Encounter: Payer: Self-pay | Admitting: Emergency Medicine

## 2022-01-05 DIAGNOSIS — Z20822 Contact with and (suspected) exposure to covid-19: Secondary | ICD-10-CM | POA: Diagnosis not present

## 2022-01-05 DIAGNOSIS — R052 Subacute cough: Secondary | ICD-10-CM | POA: Insufficient documentation

## 2022-01-05 LAB — COOXEMETRY PANEL
Carboxyhemoglobin: <0.3 % — ABNORMAL LOW (ref 0.5–1.5)
Methemoglobin: 1.4 % (ref 0.0–1.5)
O2 Saturation: 19.2 %
Total hemoglobin: 12.5 g/dL (ref 12.0–16.0)
Total oxygen content: 18.9 %

## 2022-01-05 LAB — SARS CORONAVIRUS 2 BY RT PCR: SARS Coronavirus 2 by RT PCR: NEGATIVE

## 2022-01-05 MED ORDER — BENZONATATE 100 MG PO CAPS: 100.0000 mg | ORAL_CAPSULE | Freq: Three times a day (TID) | ORAL | 0 refills | Status: DC | PRN

## 2022-01-05 NOTE — ED Provider Notes (Signed)
[facility] Provider Note    Event Date/Time   First MD Initiated Contact with Patient 01/05/22 0430     (approximate)   History   Cough   HPI  Candace Barnes is a 31 y.o. female who smokes but reports no chronic medical issues.  She presents for evaluation of a few months of intermittent cough.  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  It seems to come and go without any particular reason.  Nothing in particular makes it better or worse although it seems to be a little bit worse with exertion or exercise.  It is not worse at night.  She does not have any acid reflux.  She is not taking any medications including any medicine for hypertension.  She denies fever, sore throat, chest pain, shortness of breath, nausea, vomiting, and abdominal pain.  She has not seen a doctor about this.     Physical Exam   Triage Vital Signs: ED Triage Vitals  Enc Vitals Group     BP 01/05/22 0051 129/86     Pulse Rate 01/05/22 0051 81     Resp 01/05/22 0051 20     Temp 01/05/22 0051 98.3 F (36.8 C)     Temp Source 01/05/22 0051 Oral     SpO2 01/05/22 0051 99 %     Weight 01/05/22 0042 59 kg (130 lb)     Height 01/05/22 0042 1.829 m (6')     Head Circumference --      Peak Flow --      Pain Score 01/05/22 0042 0     Pain Loc --      Pain Edu? --      Excl. in [place]? --     Most recent vital signs: Vitals:   01/05/22 0051  BP: 129/86  Pulse: 81  Resp: 20  Temp: 98.3 F (36.8 C)  SpO2: 99%     General: Awake, no distress.  CV:  Good peripheral perfusion.  Normal heart sounds. Resp:  Normal effort.  Lungs are clear to auscultation bilaterally.  No wheezes, rales, nor rhonchi. Abd:  No distention.  Other:  Mood and affect are normal and appropriate.   ED Results / Procedures / Treatments   Labs (all labs ordered are listed, but only abnormal results are  displayed) Labs Reviewed  COOXEMETRY PANEL - Abnormal; Notable for the following components:      Result Value   Carboxyhemoglobin <0.3 (*)    All other components within normal limits  SARS CORONAVIRUS 2 BY RT PCR     RADIOLOGY I viewed and interpreted the patient's two-view chest x-ray and I see no evidence of pneumonitis, pneumonia, pulmonary edema, nor other acute abnormality.  I also read the radiologist's report, which confirmed no acute findings.    PROCEDURES:  Critical Care performed: No  Procedures   MEDICATIONS ORDERED IN ED: Medications - No data to display   IMPRESSION / MDM / [place] / ED COURSE  I reviewed the triage vital signs and the nursing notes.                              Differential diagnosis includes, but is not limited to, chemical exposure, asthma, effects of tobacco use, pneumonia, pneumothorax.  Patient's presentation is most  consistent with acute complicated illness / injury requiring diagnostic workup.  Labs/studies ordered include Co. oximetry panel, COVID-19 PCR, two-view chest x-ray.  As document above, the chest x-ray is clear.  COVID test is negative ankle oximetry is all appropriate.  I explained to the patient that there is no evidence of an emergent condition tonight, but I can refer her to pulmonology with an ambulatory referral to the pulmonology clinic.  She states that she understands.  I prescribed Tessalon Perles and encouraged close outpatient follow-up.  I also gave information about how to establish a PCP at [facility].  I gave my usual and customary return precautions.       FINAL CLINICAL IMPRESSION(S) / ED DIAGNOSES   Final diagnoses:  Subacute cough     Rx / DC Orders   ED Discharge Orders          Ordered    Ambulatory referral to Pulmonology       Comments: Patient works with caustic chemicals and is concerned that her long-term exposure might have led to her subacute/chronic cough.    01/05/22 0522    benzonatate (TESSALON PERLES) 100 MG capsule  3 times daily PRN        01/05/22 0526             Note:  This document was prepared using Dragon voice recognition software and may include unintentional dictation errors.   Hinda Kehr, MD 01/05/22 0530

## 2022-01-05 NOTE — ED Triage Notes (Signed)
Pt presents via POV with complaints of a cough for the last several months intermittently. Pt states that she works around chemicals and is unsure if shes being exposed to something hazardous. Denies CP.

## 2022-01-05 NOTE — ED Notes (Signed)
Patient up for discharge; patient left ED before this RN could review discharge paperwork.

## 2022-01-05 NOTE — Discharge Instructions (Signed)
Your workup in the Emergency Department today was reassuring.  We did not find any specific abnormalities.  We recommend you drink plenty of fluids, take your regular medications and/or any new ones prescribed today, and follow up with the doctor(s) listed in these documents as recommended.  Return to the Emergency Department if you develop new or worsening symptoms that concern you.  

## 2022-01-14 ENCOUNTER — Ambulatory Visit (HOSPITAL_COMMUNITY)
Admission: EM | Admit: 2022-01-14 | Discharge: 2022-01-14 | Disposition: A | Discharge status: Home / Self Care | Payer: Medicaid Other

## 2022-01-14 ENCOUNTER — Encounter (HOSPITAL_COMMUNITY): Payer: Self-pay | Admitting: Emergency Medicine

## 2022-01-14 DIAGNOSIS — R058 Other specified cough: Secondary | ICD-10-CM

## 2022-01-14 NOTE — ED Triage Notes (Signed)
Pt presents to UC for a work note clearing her to return to work. States she was seen on 8/2 at [facility] for a cough and possible chemical exposure. States all her tests were normal and her manager just wants a note making sure she is okay to return. Denies any other complaints.

## 2022-01-14 NOTE — Discharge Instructions (Addendum)
Continue to monitor any cough or respiratory symptoms that may occur when working. Recommend wear gloves when using any chemicals/cleaning agents for work and wear a surgical mask when using any aerosol chemicals. Recommend keep your appointment with the Pulmonologist (lung doctor) later this month for further evaluation.
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
## 2022-01-26 ENCOUNTER — Institutional Professional Consult (permissible substitution): Payer: Medicaid Other | Admitting: Pulmonary Disease

## 2022-03-20 ENCOUNTER — Encounter: Payer: Self-pay | Admitting: Intensive Care

## 2022-03-20 ENCOUNTER — Emergency Department
Admission: EM | Admit: 2022-03-20 | Discharge: 2022-03-20 | Disposition: A | Discharge status: Home / Self Care | Payer: Medicaid Other | Attending: Student in an Organized Health Care Education/Training Program | Admitting: Student in an Organized Health Care Education/Training Program

## 2022-03-20 ENCOUNTER — Other Ambulatory Visit: Payer: Self-pay

## 2022-03-20 DIAGNOSIS — L02215 Cutaneous abscess of perineum: Secondary | ICD-10-CM | POA: Insufficient documentation

## 2022-03-20 MED ORDER — AMOXICILLIN-POT CLAVULANATE 875-125 MG PO TABS: 1.0000 | ORAL_TABLET | Freq: Two times a day (BID) | ORAL | 0 refills | Status: AC

## 2022-03-20 MED ORDER — OXYCODONE-ACETAMINOPHEN 5-325 MG PO TABS
1.0000 | ORAL_TABLET | ORAL | Status: DC | PRN
  Administered 2022-03-20: 1 via ORAL
  Filled 2022-03-20: qty 1

## 2022-03-20 MED ORDER — OXYCODONE-ACETAMINOPHEN 5-325 MG PO TABS
1.0000 | ORAL_TABLET | Freq: Once | ORAL | Status: AC
  Administered 2022-03-20: 1 via ORAL
  Filled 2022-03-20: qty 1

## 2022-03-20 MED ORDER — SULFAMETHOXAZOLE-TRIMETHOPRIM 800-160 MG PO TABS: 1.0000 | ORAL_TABLET | Freq: Two times a day (BID) | ORAL | 0 refills | Status: DC

## 2022-03-20 MED ORDER — HYDROCODONE-ACETAMINOPHEN 5-325 MG PO TABS: 1.0000 | ORAL_TABLET | ORAL | 0 refills | Status: DC | PRN

## 2022-03-20 MED ORDER — SULFAMETHOXAZOLE-TRIMETHOPRIM 800-160 MG PO TABS
1.0000 | ORAL_TABLET | Freq: Once | ORAL | Status: AC
Start: 2022-03-20 — End: 2022-03-20
  Administered 2022-03-20: 1 via ORAL
  Filled 2022-03-20: qty 1

## 2022-03-20 MED ORDER — AMOXICILLIN-POT CLAVULANATE 875-125 MG PO TABS
1.0000 | ORAL_TABLET | Freq: Once | ORAL | Status: AC
  Administered 2022-03-20: 1 via ORAL
  Filled 2022-03-20: qty 1

## 2022-03-20 MED ORDER — LIDOCAINE HCL (PF) 1 % IJ SOLN
5.0000 mL | Freq: Once | INTRAMUSCULAR | Status: AC
  Administered 2022-03-20: 5 mL via INTRADERMAL
  Filled 2022-03-20: qty 5

## 2022-03-20 MED ORDER — HYDROMORPHONE HCL 1 MG/ML IJ SOLN
0.5000 mg | Freq: Once | INTRAMUSCULAR | Status: AC
  Administered 2022-03-20: 0.5 mg via INTRAMUSCULAR
  Filled 2022-03-20: qty 0.5

## 2022-03-20 NOTE — ED Triage Notes (Signed)
Patient c/o abscess on labia X3 days.

## 2022-03-20 NOTE — ED Provider Triage Note (Signed)
  Emergency Medicine Provider Triage Evaluation Note  Candace Barnes , a 31 y.o.female,  was evaluated in triage.  Pt complains of abscess on labia for the past 3 days.  Denies any bleeding or discharge.  She states that this is never happened to her before.   Review of Systems  Positive: Abscess Negative: Denies fever, chest pain, vomiting  Physical Exam   Vitals:   03/20/22 1436  BP: 133/84  Pulse: 87  Resp: 16  Temp: 97.8 F (36.6 C)  SpO2: 100%   Gen:   Awake, no distress   Resp:  Normal effort  MSK:   Moves extremities without difficulty  Other:    Medical Decision Making  Given the patient's initial medical screening exam, the following diagnostic evaluation has been ordered. The patient will be placed in the appropriate treatment space, once one is available, to complete the evaluation and treatment. I have discussed the plan of care with the patient and I have advised the patient that an ED physician or mid-level practitioner will reevaluate their condition after the test results have been received, as the results may give them additional insight into the type of treatment they may need.    Diagnostics: None immediately.  Treatments: none immediately   Teodoro Spray, [place] 03/20/22 1705

## 2022-03-20 NOTE — ED Provider Notes (Signed)
[place] EMERGENCY DEPARTMENT Provider Note   CSN: [id number] Arrival date & time: 03/20/22  1427     History  Chief Complaint  Patient presents with   Abscess    Candace Barnes is a 31 y.o. female.  Presents to the emergency department for evaluation of left vaginal pain/swelling.  She states she has a mass along the left labia that has been increasing in size over the last few days.  She describes some pain without fevers chills or drainage.  No trauma or injury.  HPI     Home Medications Prior to Admission medications   Not on File      Allergies    Patient has no known allergies.    Review of Systems   Review of Systems  Physical Exam Updated Vital Signs BP 133/84 (BP Location: Left Arm)   Pulse 87   Temp 97.8 F (36.6 C) (Oral)   Resp 16   Ht 6' (1.829 m)   Wt 61.2 kg   SpO2 100%   BMI 18.31 kg/m  Physical Exam Constitutional:      Appearance: She is well-developed.  HENT:     Head: Normocephalic and atraumatic.  Eyes:     Conjunctiva/sclera: Conjunctivae normal.  Cardiovascular:     Rate and Rhythm: Normal rate.  Pulmonary:     Effort: Pulmonary effort is normal. No respiratory distress.  Abdominal:     General: Abdomen is flat. There is no distension.     Tenderness: There is no abdominal tenderness. There is no guarding.  Genitourinary:    Comments: Left vaginal area shows a fluctuant tender erythematous abscess at the 4 o'clock position, external to the labia, there is no internal palpable cyst or abscess present.  There appears to be some slight drainage with compression along the external labia in the groin. Musculoskeletal:        General: Normal range of motion.     Cervical back: Normal range of motion.  Skin:    General: Skin is warm.     Findings: No rash.  Neurological:     General: No focal deficit present.     Mental Status: She is alert and oriented to person, place, and time.     Cranial Nerves: No  cranial nerve deficit.     Motor: No weakness.     Gait: Gait normal.  Psychiatric:        Behavior: Behavior normal.        Thought Content: Thought content normal.     ED Results / Procedures / Treatments   Labs (all labs ordered are listed, but only abnormal results are displayed) Labs Reviewed - No data to display  EKG None  Radiology No results found.  Procedures .Marland KitchenIncision and Drainage  Date/Time: 03/20/2022 8:05 PM  Performed by: Duanne Guess, PA-C Authorized by: Duanne Guess, PA-C   Consent:    Consent obtained:  Verbal   Consent given by:  Patient Location:    Type:  Abscess   Location:  Anogenital   Anogenital location:  [place] Pre-procedure details:    Skin preparation:  Antiseptic wash and chlorhexidine Anesthesia:    Anesthesia method:  Local infiltration   Local anesthetic:  Lidocaine 1% WITH epi Procedure type:    Complexity:  Simple Procedure details:    Incision types:  Stab incision   Incision depth:  Dermal   Drainage:  Purulent   Drainage amount:  Copious   Wound treatment:  Wound left open Post-procedure details:    Procedure completion:  Tolerated well, no immediate complications     Medications Ordered in ED Medications  oxyCODONE-acetaminophen (PERCOCET/ROXICET) 5-325 MG per tablet 1 tablet (1 tablet Oral Given 03/20/22 1440)  sulfamethoxazole-trimethoprim (BACTRIM DS) 800-160 MG per tablet 1 tablet (has no administration in time range)  amoxicillin-clavulanate (AUGMENTIN) 875-125 MG per tablet 1 tablet (has no administration in time range)  lidocaine (PF) (XYLOCAINE) 1 % injection 5 mL (5 mLs Intradermal Given 03/20/22 1814)  oxyCODONE-acetaminophen (PERCOCET/ROXICET) 5-325 MG per tablet 1 tablet (1 tablet Oral Given 03/20/22 1808)    ED Course/ Medical Decision Making/ A&P                           Medical Decision Making Risk Prescription drug management.   31 year old female with perineal abscess along the left  external vaginal.  Patient tolerated I&D well with significant purulent discharge from small stab incision.  Fluctuance was completely decompressed and patient placed on antibiotics.  She will follow-up with GYN for follow-up.   Final Clinical Impression(s) / ED Diagnoses Final diagnoses:  None    Rx / DC Orders ED Discharge Orders     None         Renata Caprice 03/20/22 2007    Merlyn Lot, MD 03/20/22 2220

## 2022-03-20 NOTE — Discharge Instructions (Addendum)
Please perform sitz bath's daily and take antibiotics as prescribed.  Call GYN physician tomorrow to schedule follow-up appointment.  Return for any increased pain swelling warmth redness

## 2022-03-28 ENCOUNTER — Encounter: Payer: Self-pay | Admitting: *Deleted

## 2022-03-29 ENCOUNTER — Other Ambulatory Visit: Payer: Self-pay

## 2022-03-29 ENCOUNTER — Emergency Department
Admission: EM | Admit: 2022-03-29 | Discharge: 2022-03-29 | Disposition: A | Discharge status: Home / Self Care | Payer: Medicaid Other | Attending: Emergency Medicine | Admitting: Emergency Medicine

## 2022-03-29 DIAGNOSIS — G40909 Epilepsy, unspecified, not intractable, without status epilepticus: Secondary | ICD-10-CM | POA: Diagnosis present

## 2022-03-29 DIAGNOSIS — M791 Myalgia, unspecified site: Secondary | ICD-10-CM | POA: Diagnosis not present

## 2022-03-29 DIAGNOSIS — R569 Unspecified convulsions: Secondary | ICD-10-CM

## 2022-03-29 LAB — COMPREHENSIVE METABOLIC PANEL
ALT: 16 U/L (ref 0–44)
AST: 23 U/L (ref 15–41)
Albumin: 4 g/dL (ref 3.5–5.0)
Alkaline Phosphatase: 41 U/L (ref 38–126)
Anion gap: 5 (ref 5–15)
BUN: 17 mg/dL (ref 6–20)
CO2: 26 mmol/L (ref 22–32)
Calcium: 8.8 mg/dL — ABNORMAL LOW (ref 8.9–10.3)
Chloride: 104 mmol/L (ref 98–111)
Creatinine, Ser: 0.94 mg/dL (ref 0.44–1.00)
GFR, Estimated: >60 mL/min (ref >60)
Glucose, Bld: 111 mg/dL — ABNORMAL HIGH (ref 70–99)
Potassium: 3.9 mmol/L (ref 3.5–5.1)
Sodium: 135 mmol/L (ref 135–145)
Total Bilirubin: 1 mg/dL (ref 0.3–1.2)
Total Protein: 8.1 g/dL (ref 6.5–8.1)

## 2022-03-29 LAB — URINALYSIS, COMPLETE (UACMP) WITH MICROSCOPIC
Bacteria, UA: NONE SEEN
Bilirubin Urine: NEGATIVE
Glucose, UA: NEGATIVE mg/dL
Ketones, ur: NEGATIVE mg/dL
Leukocytes,Ua: NEGATIVE
Nitrite: NEGATIVE
Protein, ur: 30 mg/dL — AB
Specific Gravity, Urine: 1.024 (ref 1.005–1.030)
pH: 7 (ref 5.0–8.0)

## 2022-03-29 LAB — CBC
HCT: 36 % (ref 36.0–46.0)
Hemoglobin: 11.2 g/dL — ABNORMAL LOW (ref 12.0–15.0)
MCH: 21 pg — ABNORMAL LOW (ref 26.0–34.0)
MCHC: 31.1 g/dL (ref 30.0–36.0)
MCV: 67.5 fL — ABNORMAL LOW (ref 80.0–100.0)
Platelets: 253 10*3/uL (ref 150–400)
RBC: 5.33 MIL/uL — ABNORMAL HIGH (ref 3.87–5.11)
RDW: 18.8 % — ABNORMAL HIGH (ref 11.5–15.5)
WBC: 5.8 10*3/uL (ref 4.0–10.5)
nRBC: 0 % (ref 0.0–0.2)

## 2022-03-29 LAB — POC URINE PREG, ED: Preg Test, Ur: NEGATIVE

## 2022-03-29 MED ORDER — KETOROLAC TROMETHAMINE 30 MG/ML IJ SOLN
30.0000 mg | Freq: Once | INTRAMUSCULAR | Status: AC
  Administered 2022-03-29: 30 mg via INTRAVENOUS
  Filled 2022-03-29: qty 1

## 2022-03-29 MED ORDER — SODIUM CHLORIDE 0.9 % IV BOLUS
1000.0000 mL | Freq: Once | INTRAVENOUS | Status: AC
  Administered 2022-03-29: 1000 mL via INTRAVENOUS

## 2022-03-29 NOTE — ED Triage Notes (Addendum)
Per EMS boyfriend witnessed pt having a "grand mal seizure" Pt sts she has a hx of seizures but has not been taking seizure medication for years. Pt sts last seizure was in July but she did not go to the hospital. Pt is aaox4 at this time. EMS did not witness a post-ictal state.

## 2022-03-29 NOTE — ED Provider Notes (Signed)
[facility] Provider Note    Event Date/Time   First MD Initiated Contact with Patient 03/29/22 [phone]     (approximate)  History   Chief Complaint: Seizures  HPI  Candace Barnes is a 31 y.o. female with a past medical history of migraines, seizure disorder, presents the emergency department after a seizure.  According to the patient she states she went to lay down and she believes she had a seizure, witnessed by her boyfriend.  Patient states that seizure history, had seen a neurologist in the past at 1 point was prescribed Keppra but states it did not work for her so the patient stopped taking it.  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  Patient not currently on any antiepileptic medications.  States occasional alcohol use but not frequent per patient.  Physical Exam   Triage Vital Signs: ED Triage Vitals  Enc Vitals Group     BP --      Pulse Rate 03/29/22 0047 (!) 57     Resp 03/29/22 0047 16     Temp 03/29/22 0047 97.7 F (36.5 C)     Temp src --      SpO2 03/29/22 0047 100 %     Weight 03/29/22 0045 130 lb (59 kg)     Height 03/29/22 0045 6' (1.829 m)     Head Circumference --      Peak Flow --      Pain Score 03/29/22 0045 3     Pain Loc --      Pain Edu? --      Excl. in [place]? --     Most recent vital signs: Vitals:   03/29/22 0047  Pulse: (!) 57  Resp: 16  Temp: 97.7 F (36.5 C)  SpO2: 100%    General: Awake, no distress.  CV:  Good peripheral perfusion.  Regular rate and rhythm  Resp:  Normal effort.  Equal breath sounds bilaterally.  Abd:  No distention.  Soft, nontender.  No rebound or guarding.   ED Results / Procedures / Treatments   EKG  EKG viewed and interpreted by myself shows normal sinus rhythm at 66 bpm with a narrow QRS, normal axis, normal intervals, no concerning ST changes.   MEDICATIONS ORDERED IN ED: Medications  sodium chloride 0.9 % bolus 1,000 mL (has no administration in time  range)     IMPRESSION / MDM / ASSESSMENT AND PLAN / ED COURSE  I reviewed the triage vital signs and the nursing notes.  Patient's presentation is most consistent with acute presentation with potential threat to life or bodily function.  Patient presents emergency department after reported seizure at home witnessed by her boyfriend.  Overall the patient appears well currently.  Her only complaint is some body aches including in her chest which she states is fairly typical after her seizure.  We will obtain an EKG and basic labs as a precaution.  We will IV hydrate and continue to closely monitor.  Patient states it has been many years since she has followed up with a neurologist.  I discussed with the patient I believe it would be warranted for possible EEG and to discuss medications.  Patient agreeable.  Patient's work-up is reassuring with a normal CBC, reassuring/normal chemistry and a normal urinalysis.  Patient states she is feeling better and is ready to go home.  I discussed with the patient following up with a neurologist, patient agreeable to plan of care.  Provided my typical seizure return precautions.  FINAL CLINICAL IMPRESSION(S) / ED DIAGNOSES   Seizure   Note:  This document was prepared using Dragon voice recognition software and may include unintentional dictation errors.   Harvest Dark, MD 03/29/22 [phone]

## 2022-03-29 NOTE — ED Notes (Signed)
ED Provider at bedside. 

## 2022-06-30 ENCOUNTER — Emergency Department: Payer: Medicaid Other

## 2022-06-30 ENCOUNTER — Emergency Department
Admission: EM | Admit: 2022-06-30 | Discharge: 2022-06-30 | Discharge status: Against Medical Advice | Payer: Medicaid Other | Attending: Emergency Medicine | Admitting: Emergency Medicine

## 2022-06-30 ENCOUNTER — Other Ambulatory Visit: Payer: Self-pay

## 2022-06-30 DIAGNOSIS — Z5321 Procedure and treatment not carried out due to patient leaving prior to being seen by health care provider: Secondary | ICD-10-CM | POA: Insufficient documentation

## 2022-06-30 DIAGNOSIS — F17200 Nicotine dependence, unspecified, uncomplicated: Secondary | ICD-10-CM | POA: Diagnosis not present

## 2022-06-30 DIAGNOSIS — R079 Chest pain, unspecified: Secondary | ICD-10-CM | POA: Insufficient documentation

## 2022-06-30 LAB — BASIC METABOLIC PANEL
Anion gap: 6 (ref 5–15)
BUN: 17 mg/dL (ref 6–20)
CO2: 26 mmol/L (ref 22–32)
Calcium: 8.8 mg/dL — ABNORMAL LOW (ref 8.9–10.3)
Chloride: 104 mmol/L (ref 98–111)
Creatinine, Ser: 0.81 mg/dL (ref 0.44–1.00)
GFR, Estimated: >60 mL/min (ref >60)
Glucose, Bld: 76 mg/dL (ref 70–99)
Potassium: 3.6 mmol/L (ref 3.5–5.1)
Sodium: 136 mmol/L (ref 135–145)

## 2022-06-30 LAB — CBC
HCT: 33.1 % — ABNORMAL LOW (ref 36.0–46.0)
Hemoglobin: 10.1 g/dL — ABNORMAL LOW (ref 12.0–15.0)
MCH: 20.7 pg — ABNORMAL LOW (ref 26.0–34.0)
MCHC: 30.5 g/dL (ref 30.0–36.0)
MCV: 67.8 fL — ABNORMAL LOW (ref 80.0–100.0)
Platelets: 213 10*3/uL (ref 150–400)
RBC: 4.88 MIL/uL (ref 3.87–5.11)
RDW: 18.9 % — ABNORMAL HIGH (ref 11.5–15.5)
WBC: 4.7 10*3/uL (ref 4.0–10.5)
nRBC: 0 % (ref 0.0–0.2)

## 2022-06-30 LAB — TROPONIN I (HIGH SENSITIVITY): Troponin I (High Sensitivity): <2 ng/L (ref <18)

## 2022-06-30 NOTE — ED Triage Notes (Signed)
Pt presents to ED with c/o of CP for 2 days. Pt denies any other issues. Pt does endorse smoking.

## 2022-08-17 ENCOUNTER — Other Ambulatory Visit: Payer: Self-pay

## 2022-08-17 DIAGNOSIS — R519 Headache, unspecified: Secondary | ICD-10-CM | POA: Diagnosis present

## 2022-08-17 NOTE — ED Triage Notes (Signed)
Pt reports h/a and sinus pressure all day. Denies taking medicine at home. Reports hx of seizures and wants to be sure it's not an aura. Reports her typical aura is that her feet go numb. Pt alert and oriented following commands. Breathing unlabored with symmetric chest rise and fall. Pt denies dizziness or vision change. Pt ambulatory

## 2022-08-18 ENCOUNTER — Emergency Department
Admission: EM | Admit: 2022-08-18 | Discharge: 2022-08-18 | Disposition: A | Discharge status: Home / Self Care | Payer: Medicaid Other | Attending: Emergency Medicine | Admitting: Emergency Medicine

## 2022-08-18 DIAGNOSIS — R519 Headache, unspecified: Secondary | ICD-10-CM

## 2022-08-18 MED ORDER — IBUPROFEN 400 MG PO TABS
400.0000 mg | ORAL_TABLET | Freq: Once | ORAL | Status: AC
  Administered 2022-08-18: 400 mg via ORAL
  Filled 2022-08-18: qty 1

## 2022-08-18 MED ORDER — ACETAMINOPHEN 500 MG PO TABS
1000.0000 mg | ORAL_TABLET | Freq: Once | ORAL | Status: AC
  Administered 2022-08-18: 1000 mg via ORAL
  Filled 2022-08-18: qty 2
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
## 2022-08-18 NOTE — Discharge Instructions (Signed)
I suspect that you are having some pain related to the sinuses.  Take ibuprofen 400 mg every 6 hours.  I would also start taking Flonase which is a nasal decongestant that you can buy at the drugstore.

## 2022-11-04 ENCOUNTER — Ambulatory Visit (HOSPITAL_COMMUNITY)
Admission: EM | Admit: 2022-11-04 | Discharge: 2022-11-04 | Disposition: A | Discharge status: Home / Self Care | Payer: Medicaid Other | Attending: Physician Assistant | Admitting: Physician Assistant

## 2022-11-04 ENCOUNTER — Encounter (HOSPITAL_COMMUNITY): Payer: Self-pay

## 2022-11-04 DIAGNOSIS — J069 Acute upper respiratory infection, unspecified: Secondary | ICD-10-CM

## 2022-11-04 DIAGNOSIS — R051 Acute cough: Secondary | ICD-10-CM

## 2022-11-04 MED ORDER — FLUTICASONE PROPIONATE 50 MCG/ACT NA SUSP: 1.0000 | Freq: Every day | NASAL | 0 refills | Status: DC

## 2022-11-04 MED ORDER — PROMETHAZINE-DM 6.25-15 MG/5ML PO SYRP: 5.0000 mL | ORAL_SOLUTION | Freq: Two times a day (BID) | ORAL | 0 refills | Status: DC | PRN
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
## 2022-11-04 NOTE — Discharge Instructions (Signed)
I suspect that you have a viral illness.  This will take several days to go away on its own.  Use fluticasone nasal spray to help with your congestion.  I also recommend nasal saline sinus rinses.  Take Promethazine DM for cough.  This will make you sleepy so do not drive or drink alcohol with taking it.  You can use over-the-counter medication including Mucinex, Tylenol, ibuprofen as needed.  Gargle with warm salt water make sure you are drinking plenty of fluid.  If your symptoms or not improving within a week please return.  If anything worsens and you have severe cough, shortness of breath, fever, nausea, vomiting you should be seen immediately.

## 2022-11-04 NOTE — ED Triage Notes (Signed)
Pt c/o cough, runny nose, and scratchy throat since yesterday. Took allergy meds with no relief.

## 2022-12-05 ENCOUNTER — Emergency Department (HOSPITAL_COMMUNITY)
Admission: EM | Admit: 2022-12-05 | Discharge: 2022-12-05 | Discharge status: Against Medical Advice | Payer: Medicaid Other | Attending: Emergency Medicine | Admitting: Emergency Medicine

## 2022-12-05 ENCOUNTER — Other Ambulatory Visit: Payer: Self-pay

## 2022-12-05 DIAGNOSIS — R5383 Other fatigue: Secondary | ICD-10-CM | POA: Insufficient documentation

## 2022-12-05 DIAGNOSIS — Z5321 Procedure and treatment not carried out due to patient leaving prior to being seen by health care provider: Secondary | ICD-10-CM | POA: Insufficient documentation

## 2022-12-05 DIAGNOSIS — R200 Anesthesia of skin: Secondary | ICD-10-CM | POA: Insufficient documentation

## 2022-12-05 LAB — CBC WITH DIFFERENTIAL/PLATELET
Abs Immature Granulocytes: 0.01 10*3/uL (ref 0.00–0.07)
Basophils Absolute: 0 10*3/uL (ref 0.0–0.1)
Basophils Relative: 0 %
Eosinophils Absolute: 0 10*3/uL (ref 0.0–0.5)
Eosinophils Relative: 1 %
HCT: 36.2 % (ref 36.0–46.0)
Hemoglobin: 11.4 g/dL — ABNORMAL LOW (ref 12.0–15.0)
Immature Granulocytes: 0 %
Lymphocytes Relative: 39 %
Lymphs Abs: 1.9 10*3/uL (ref 0.7–4.0)
MCH: 21.1 pg — ABNORMAL LOW (ref 26.0–34.0)
MCHC: 31.5 g/dL (ref 30.0–36.0)
MCV: 67 fL — ABNORMAL LOW (ref 80.0–100.0)
Monocytes Absolute: 0.3 10*3/uL (ref 0.1–1.0)
Monocytes Relative: 6 %
Neutro Abs: 2.6 10*3/uL (ref 1.7–7.7)
Neutrophils Relative %: 54 %
Platelets: 233 10*3/uL (ref 150–400)
RBC: 5.4 MIL/uL — ABNORMAL HIGH (ref 3.87–5.11)
RDW: 18.8 % — ABNORMAL HIGH (ref 11.5–15.5)
WBC: 4.9 10*3/uL (ref 4.0–10.5)
nRBC: 0 % (ref 0.0–0.2)

## 2022-12-05 LAB — HCG, SERUM, QUALITATIVE: Preg, Serum: NEGATIVE

## 2022-12-05 LAB — BASIC METABOLIC PANEL
Anion gap: 7 (ref 5–15)
BUN: 14 mg/dL (ref 6–20)
CO2: 24 mmol/L (ref 22–32)
Calcium: 8.5 mg/dL — ABNORMAL LOW (ref 8.9–10.3)
Chloride: 104 mmol/L (ref 98–111)
Creatinine, Ser: 0.85 mg/dL (ref 0.44–1.00)
GFR, Estimated: >60 mL/min (ref >60)
Glucose, Bld: 98 mg/dL (ref 70–99)
Potassium: 3.7 mmol/L (ref 3.5–5.1)
Sodium: 135 mmol/L (ref 135–145)

## 2022-12-05 LAB — URINALYSIS, ROUTINE W REFLEX MICROSCOPIC
Bacteria, UA: NONE SEEN
Bilirubin Urine: NEGATIVE
Glucose, UA: NEGATIVE mg/dL
Hgb urine dipstick: NEGATIVE
Ketones, ur: NEGATIVE mg/dL
Leukocytes,Ua: NEGATIVE
Nitrite: NEGATIVE
Protein, ur: 30 mg/dL — AB
Specific Gravity, Urine: 1.025 (ref 1.005–1.030)
pH: 6 (ref 5.0–8.0)

## 2022-12-05 NOTE — ED Triage Notes (Signed)
Pt arrives to ED c/o having multiple auras and feeling "strange" everyday for the past week. Pt with hx of epilepsy and reports this is the way that she feels when an event happens. Pt states that it has been a month since last epileptic episode

## 2022-12-05 NOTE — ED Provider Triage Note (Signed)
Emergency Medicine Provider Triage Evaluation Note  Candace Barnes , a 32 y.o. female  was evaluated in triage.  Pt complains of numbness to both feet and peri-orally for past week. 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  Used to be on Keppra for this but stopped this medication on her own accord.  States that she has not had any convulsions.  States that she has felt generally fatigued and believes that she is under increased stress and this is causing her symptoms but came to the ED to be evaluated as they have been so persistent.  Says that her appetite has been poor but no nausea, vomiting, diarrhea, abdominal pain, chest pain, or shortness of breath.  No urinary symptoms.  No injury.  No focal weakness, vision changes, headache.  Currently asymptomatic apart from fatigue.  Denies chance of pregnancy.  Review of Systems  Positive: See HPI Negative: See HPI  Physical Exam  BP 129/88 (BP Location: Right Arm)   Pulse 74   Temp 99.3 F (37.4 C) (Oral)   Resp 16   Ht 6' (1.829 m)   Wt 57.2 kg   SpO2 100%   BMI 17.09 kg/m  Gen:   Awake, no distress   Resp:  Normal effort lungs clear to auscultation MSK:   Moves extremities without difficulty  Other:  5/5 strength to bilateral upper and lower extremities, cranial nerves intact, alert and oriented, abdomen soft and nontender  Medical Decision Making  Medically screening exam initiated at 9:08 PM.  Appropriate orders placed.  Candace Barnes was informed that the remainder of the evaluation will be completed by another provider, this initial triage assessment does not replace that evaluation, and the importance of remaining in the ED until their evaluation is complete.     Richardson Dopp 12/05/22 2113

## 2022-12-05 NOTE — ED Notes (Signed)
Pt not responding to take to treatment bed

## 2022-12-07 ENCOUNTER — Other Ambulatory Visit: Payer: Self-pay

## 2022-12-07 ENCOUNTER — Emergency Department (HOSPITAL_COMMUNITY)
Admission: EM | Admit: 2022-12-07 | Discharge: 2022-12-07 | Disposition: A | Discharge status: Home / Self Care | Payer: Medicaid Other | Attending: Emergency Medicine | Admitting: Emergency Medicine

## 2022-12-07 ENCOUNTER — Encounter (HOSPITAL_COMMUNITY): Payer: Self-pay

## 2022-12-07 DIAGNOSIS — R569 Unspecified convulsions: Secondary | ICD-10-CM | POA: Insufficient documentation

## 2022-12-07 LAB — CBC
HCT: 36.4 % (ref 36.0–46.0)
Hemoglobin: 11.3 g/dL — ABNORMAL LOW (ref 12.0–15.0)
MCH: 21.4 pg — ABNORMAL LOW (ref 26.0–34.0)
MCHC: 31 g/dL (ref 30.0–36.0)
MCV: 68.8 fL — ABNORMAL LOW (ref 80.0–100.0)
Platelets: 214 10*3/uL (ref 150–400)
RBC: 5.29 MIL/uL — ABNORMAL HIGH (ref 3.87–5.11)
RDW: 19.1 % — ABNORMAL HIGH (ref 11.5–15.5)
WBC: 9.5 10*3/uL (ref 4.0–10.5)
nRBC: 0 % (ref 0.0–0.2)

## 2022-12-07 LAB — COMPREHENSIVE METABOLIC PANEL
ALT: 15 U/L (ref 0–44)
AST: 17 U/L (ref 15–41)
Albumin: 3.9 g/dL (ref 3.5–5.0)
Alkaline Phosphatase: 39 U/L (ref 38–126)
Anion gap: 5 (ref 5–15)
BUN: 11 mg/dL (ref 6–20)
CO2: 25 mmol/L (ref 22–32)
Calcium: 8.6 mg/dL — ABNORMAL LOW (ref 8.9–10.3)
Chloride: 105 mmol/L (ref 98–111)
Creatinine, Ser: 0.97 mg/dL (ref 0.44–1.00)
GFR, Estimated: >60 mL/min (ref >60)
Glucose, Bld: 82 mg/dL (ref 70–99)
Potassium: 3.3 mmol/L — ABNORMAL LOW (ref 3.5–5.1)
Sodium: 135 mmol/L (ref 135–145)
Total Bilirubin: 2.4 mg/dL — ABNORMAL HIGH (ref 0.3–1.2)
Total Protein: 7.1 g/dL (ref 6.5–8.1)

## 2022-12-07 LAB — LACTIC ACID, PLASMA: Lactic Acid, Venous: 1.4 mmol/L (ref 0.5–1.9)

## 2022-12-07 LAB — ETHANOL: Alcohol, Ethyl (B): <10 mg/dL (ref <10)

## 2022-12-07 MED ORDER — LEVETIRACETAM IN NACL 1000 MG/100ML IV SOLN
1000.0000 mg | Freq: Once | INTRAVENOUS | Status: AC
  Administered 2022-12-07: 1000 mg via INTRAVENOUS
  Filled 2022-12-07: qty 100

## 2022-12-07 MED ORDER — SODIUM CHLORIDE 0.9 % IV BOLUS (SEPSIS)
1000.0000 mL | Freq: Once | INTRAVENOUS | Status: AC
  Administered 2022-12-07: 1000 mL via INTRAVENOUS

## 2022-12-07 MED ORDER — SODIUM CHLORIDE 0.9 % IV SOLN: 1000.0000 mL | INTRAVENOUS | Status: DC

## 2022-12-07 NOTE — Discharge Instructions (Addendum)
You have been seen today for concern for a possible seizure experienced prior to your arrival in the ER.  Please restart taking your Keppra at home as directed by your neurologist.   Due to concern for seizure, you may not drive until you see a neurologist to discuss your seizures. If you have not had a seizure for 6 months, you may resume driving. Please follow up with your neurologist Dr. Patrcia Dolly soon as possible discussed your possible seizure and restarting your Keppra.  Return to the ER if you develop severe headache, vomiting, chest pain, shortness of breath, seizure that does not stop.

## 2022-12-07 NOTE — ED Triage Notes (Signed)
Pt brought in by EMS for report of pseudoseizure while at work. 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 Pt denies aura prior to seizure. No loss of bowel/bladder. Unknown amount of time seizure lasted. Pt reports neck pain. Unknown if patient fell. EMS denies any seizure like activity for them. Pt states she is supposed to be on Keppra but doesn't take it because "it doesn't help".

## 2022-12-07 NOTE — ED Provider Notes (Signed)
[place] EMERGENCY DEPARTMENT AT [facility] Provider Note   CSN: [id number] Arrival date & time: 12/07/22  2024     History  Chief Complaint  Patient presents with   Seizures   Coworker at bedside who helps provide the history  Candace Barnes is a 32 y.o. female who presents with concern of having a seizure prior to arrival.  She has a friend at bedside who witnessed this episode.  The friend reports that she was walking to the car when she dropped to the ground and started shaking.  Her coworker was there and was able to prevent her head from hitting the ground.  Patient reports she was not feeling well at work today and felt little bit lightheaded before she had this episode.  She reports now feeling tired but otherwise feels well.  She was previously placed on Keppra by a neurologist previously but she discontinued this medication.  She does not report having any adverse side effects to this medication.     Seizures      Home Medications Prior to Admission medications   Medication Sig Start Date End Date Taking? Authorizing Provider  fluticasone (FLONASE) 50 MCG/ACT nasal spray Place 1 spray into both nostrils daily. Patient not taking: Reported on 12/07/2022 11/04/22   Raspet, Noberto Retort, PA-C  promethazine-dextromethorphan (PROMETHAZINE-DM) 6.25-15 MG/5ML syrup Take 5 mLs by mouth 2 (two) times daily as needed for cough. Patient not taking: Reported on 12/07/2022 11/04/22   Raspet, Noberto Retort, PA-C      Allergies    Patient has no known allergies.    Review of Systems   Review of Systems  Neurological:  Positive for seizures.    Physical Exam Updated Vital Signs BP 103/79 (BP Location: Left Arm)   Pulse 74   Temp 98.7 F (37.1 C) (Oral)   Resp 16   Ht 6' (1.829 m)   Wt 54.4 kg   SpO2 100%   BMI 16.27 kg/m  Physical Exam Vitals and nursing note reviewed.  Constitutional:      Comments: Tired appearing  HENT:     Head: Normocephalic and atraumatic.  Eyes:      Extraocular Movements: Extraocular movements intact.     Pupils: Pupils are equal, round, and reactive to light.  Cardiovascular:     Rate and Rhythm: Normal rate and regular rhythm.     Heart sounds: No murmur heard. Pulmonary:     Effort: Pulmonary effort is normal.     Breath sounds: Normal breath sounds.  Musculoskeletal:        General: No deformity.     Cervical back: Normal range of motion.  Neurological:     General: No focal deficit present.     Mental Status: She is alert.  Psychiatric:        Mood and Affect: Mood normal.     ED Results / Procedures / Treatments   Labs (all labs ordered are listed, but only abnormal results are displayed) Labs Reviewed  CBC - Abnormal; Notable for the following components:      Result Value   RBC 5.29 (*)    Hemoglobin 11.3 (*)    MCV 68.8 (*)    MCH 21.4 (*)    RDW 19.1 (*)    All other components within normal limits  COMPREHENSIVE METABOLIC PANEL - Abnormal; Notable for the following components:   Potassium 3.3 (*)    Calcium 8.6 (*)    Total Bilirubin 2.4 (*)  All other components within normal limits  ETHANOL  LACTIC ACID, PLASMA  LACTIC ACID, PLASMA  RAPID URINE DRUG SCREEN, HOSP PERFORMED  URINALYSIS, ROUTINE W REFLEX MICROSCOPIC    EKG None  Radiology No results found.  Procedures Procedures    Medications Ordered in ED Medications  sodium chloride 0.9 % bolus 1,000 mL (0 mLs Intravenous Stopped 12/07/22 2208)  levETIRAcetam (KEPPRA) IVPB 1000 mg/100 mL premix (0 mg Intravenous Stopped 12/07/22 2251)    ED Course/ Medical Decision Making/ A&P                             Medical Decision Making Amount and/or Complexity of Data Reviewed Labs: ordered.  Risk Prescription drug management.   32 y.o. female with pertinent past medical history of seizures placed on Keppra by neurologist, patient discontinued Keppra presents to the ED for concern of possible seizure  Differential diagnosis includes  but is not limited to seizure, pseudoseizure, syncope  ED Course:  Coworker reports the patient fell down and had about 7 episodes of shaking before the patient came to.  Patient reports she overall feels well but is tired now.  Since she was feeling unwell and lightheaded before she lost consciousness, suspect vasovagal syncope or seizure.  Patient now feels fine but tired.  Vital signs stable.  Will obtain CBC, CMP, urinalysis, urine drug screen, lactic acid.  Since this is not a first-time seizure, will not obtain head imaging at this time. Will give patient IV fluid bolus since blood pressure is on the lower side here.  Will also give Keppra dose since she started on this medication previously denies concern for seizure. 11:32 PM: No concerning findings on labs.  Upon re-evaluation, patient still feels well.  She understands she needs to follow-up with her neurologist and started on her Keppra.  She understands she cannot drive until she sees her neurologist or his seizure-free for 6 months  Impression: Concern for seizure prior to arrival  Disposition:  The patient was discharged home with instructions to follow-up with her neurologist as soon as possible to discuss her seizures and her Keppra medication.  Seizure precautions given, she was instructed she is not able to drive until she gets to see her neurologist.  Strict return precautions given.   Lab Tests: I Ordered, and personally interpreted labs.  The pertinent results include:   CBC with some abnormal lab values that are consistent with patient baseline Lactic acid normal, ETOH normal CMP with an elevated bilirubin  Imaging Studies ordered: Not indicated   Cardiac Monitoring: / EKG: Not indicated   Consultations Obtained: Not indicated  Co morbidities that complicate the patient evaluation  History of seizures  Social Determinants of Health:  unknown              Final Clinical Impression(s) / ED  Diagnoses Final diagnoses:  Seizure-like activity [facility])    Rx / DC Orders ED Discharge Orders     None         Arabella Merles, PA-C 12/07/22 2340    Linwood Dibbles, MD 12/08/22 1504

## 2023-01-12 ENCOUNTER — Inpatient Hospital Stay (HOSPITAL_COMMUNITY)
Admission: AD | Admit: 2023-01-12 | Discharge: 2023-01-13 | Disposition: A | Discharge status: Home / Self Care | Payer: Medicaid Other | Attending: Obstetrics & Gynecology | Admitting: Obstetrics & Gynecology

## 2023-01-12 ENCOUNTER — Encounter (HOSPITAL_COMMUNITY): Payer: Self-pay | Admitting: *Deleted

## 2023-01-12 DIAGNOSIS — O26891 Other specified pregnancy related conditions, first trimester: Secondary | ICD-10-CM | POA: Diagnosis not present

## 2023-01-12 DIAGNOSIS — O209 Hemorrhage in early pregnancy, unspecified: Secondary | ICD-10-CM | POA: Diagnosis not present

## 2023-01-12 DIAGNOSIS — O26899 Other specified pregnancy related conditions, unspecified trimester: Secondary | ICD-10-CM

## 2023-01-12 DIAGNOSIS — Z3201 Encounter for pregnancy test, result positive: Secondary | ICD-10-CM | POA: Insufficient documentation

## 2023-01-12 DIAGNOSIS — Z3A01 Less than 8 weeks gestation of pregnancy: Secondary | ICD-10-CM | POA: Insufficient documentation

## 2023-01-12 DIAGNOSIS — O3680X0 Pregnancy with inconclusive fetal viability, not applicable or unspecified: Secondary | ICD-10-CM

## 2023-01-12 DIAGNOSIS — R109 Unspecified abdominal pain: Secondary | ICD-10-CM | POA: Diagnosis not present

## 2023-01-12 LAB — POCT PREGNANCY, URINE: Preg Test, Ur: POSITIVE — AB

## 2023-01-12 NOTE — MAU Note (Signed)
.  Candace Barnes is a 32 y.o. at Unknown here in MAU reporting having 2 positive pregnancy tests 2 days ago. Had scant bleeding 2 days ago. Tonight at work felt wet and went to the BR and had bright vag bleeding. Occ pain LLQ of abdomen that is crampy.  LMP: 12/10/22 Onset of complaint: 2 days ago Pain score: 2 Vitals:   01/12/23 2316  BP: 108/73  Pulse: 61  Resp: 17  Temp: 98.7 F (37.1 C)  SpO2: 100%     FHT:n/a Lab orders placed from triage:  upt
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
## 2023-01-13 ENCOUNTER — Encounter (HOSPITAL_COMMUNITY): Payer: Self-pay | Admitting: Obstetrics & Gynecology

## 2023-01-13 ENCOUNTER — Inpatient Hospital Stay (HOSPITAL_COMMUNITY): Payer: Medicaid Other

## 2023-01-13 DIAGNOSIS — Z3A01 Less than 8 weeks gestation of pregnancy: Secondary | ICD-10-CM

## 2023-01-13 DIAGNOSIS — R109 Unspecified abdominal pain: Secondary | ICD-10-CM

## 2023-01-13 DIAGNOSIS — O209 Hemorrhage in early pregnancy, unspecified: Secondary | ICD-10-CM

## 2023-01-13 DIAGNOSIS — O3680X0 Pregnancy with inconclusive fetal viability, not applicable or unspecified: Secondary | ICD-10-CM

## 2023-01-13 DIAGNOSIS — O26891 Other specified pregnancy related conditions, first trimester: Secondary | ICD-10-CM

## 2023-01-13 LAB — HCG, QUANTITATIVE, PREGNANCY: hCG, Beta Chain, Quant, S: 3464 m[IU]/mL — ABNORMAL HIGH (ref <5)

## 2023-01-13 NOTE — Progress Notes (Signed)
Candace Barnes CNM in earlier to discuss test results and d/c plan. Written and verbal d/c instructions given and understanding voiced °

## 2023-01-19 ENCOUNTER — Ambulatory Visit (HOSPITAL_COMMUNITY): Admission: RE | Admit: 2023-01-19 | Payer: Medicaid Other | Source: Ambulatory Visit

## 2023-01-23 ENCOUNTER — Ambulatory Visit (HOSPITAL_COMMUNITY): Payer: Medicaid Other

## 2023-02-03 ENCOUNTER — Encounter (HOSPITAL_COMMUNITY): Payer: Self-pay | Admitting: *Deleted

## 2023-02-03 ENCOUNTER — Ambulatory Visit (HOSPITAL_COMMUNITY)
Admission: EM | Admit: 2023-02-03 | Discharge: 2023-02-03 | Disposition: A | Discharge status: Home / Self Care | Payer: Medicaid Other

## 2023-02-03 ENCOUNTER — Other Ambulatory Visit: Payer: Self-pay

## 2023-02-03 DIAGNOSIS — Z3A01 Less than 8 weeks gestation of pregnancy: Secondary | ICD-10-CM | POA: Diagnosis not present

## 2023-02-03 DIAGNOSIS — O26811 Pregnancy related exhaustion and fatigue, first trimester: Secondary | ICD-10-CM | POA: Diagnosis not present
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
## 2023-02-03 NOTE — ED Triage Notes (Signed)
Pt reports she is [redacted] weeks pregnant and ha had nausea,head aches ,fatigue .

## 2023-02-03 NOTE — Discharge Instructions (Signed)
Rest for the weekend and stay hydrated. Remain out of work until Tuesday. Follow up as needed. Schedule appointment with Ob/Gyn.

## 2023-02-09 ENCOUNTER — Emergency Department (HOSPITAL_COMMUNITY)
Admission: EM | Admit: 2023-02-09 | Discharge: 2023-02-09 | Disposition: A | Discharge status: Home / Self Care | Payer: Medicaid Other | Attending: Emergency Medicine | Admitting: Emergency Medicine

## 2023-02-09 ENCOUNTER — Encounter (HOSPITAL_COMMUNITY): Payer: Self-pay

## 2023-02-09 DIAGNOSIS — R110 Nausea: Secondary | ICD-10-CM | POA: Diagnosis not present

## 2023-02-09 DIAGNOSIS — J069 Acute upper respiratory infection, unspecified: Secondary | ICD-10-CM | POA: Insufficient documentation

## 2023-02-09 DIAGNOSIS — Z1152 Encounter for screening for COVID-19: Secondary | ICD-10-CM | POA: Insufficient documentation

## 2023-02-09 DIAGNOSIS — R059 Cough, unspecified: Secondary | ICD-10-CM | POA: Diagnosis present

## 2023-02-09 LAB — RESP PANEL BY RT-PCR (RSV, FLU A&B, COVID)  RVPGX2
Influenza A by PCR: NEGATIVE
Influenza B by PCR: NEGATIVE
Resp Syncytial Virus by PCR: NEGATIVE
SARS Coronavirus 2 by RT PCR: NEGATIVE

## 2023-02-09 MED ORDER — DOXYLAMINE-PYRIDOXINE 10-10 MG PO TBEC: 2.0000 | DELAYED_RELEASE_TABLET | Freq: Every day | ORAL | 0 refills | Status: DC

## 2023-02-09 MED ORDER — ONDANSETRON 4 MG PO TBDP: 4.0000 mg | ORAL_TABLET | Freq: Three times a day (TID) | ORAL | 0 refills | Status: DC | PRN

## 2023-02-09 NOTE — ED Triage Notes (Signed)
Pt reports she thinks she has covid. Visited her grandmother last weekend who is covid positive. A few days later started feeling fatigued, sore throat and runny nose. States symptoms feel similar to last time she had covid. Symptoms getting worse. Pt is 2 months pregnant.

## 2023-02-09 NOTE — Discharge Instructions (Signed)
Your history, exam, evaluation today are consistent with a viral infection causing the upper respiratory symptoms.  You were negative for COVID/flu/RSV however given your COVID exposure recently, this still could be a COVID infection and just tested negative.  Please rest and stay hydrated.  Please use the nausea medicine to help with nausea if it continues.  Please follow-up with your OB/GYN and PCP.  If any symptoms change or worsen acutely, return to the nearest emergency department.

## 2023-02-09 NOTE — ED Provider Notes (Signed)
[place] EMERGENCY DEPARTMENT AT [facility] Provider Note   CSN: [id number] Arrival date & time: 02/09/23  0827     History  No chief complaint on file.   Candace Barnes is a 32 y.o. female.  The history is provided by the patient and medical records. No language interpreter was used.  URI Presenting symptoms: congestion, cough, fatigue and sore throat   Presenting symptoms: no fever   Severity:  Mild Onset quality:  Gradual Duration:  6 days Timing:  Constant Progression:  Waxing and waning Chronicity:  New Relieved by:  Nothing Worsened by:  Nothing Ineffective treatments:  None tried Associated symptoms: no headaches, no neck pain and no wheezing   Risk factors: sick contacts        Home Medications Prior to Admission medications   Medication Sig Start Date End Date Taking? Authorizing Provider  fluticasone (FLONASE) 50 MCG/ACT nasal spray Place 1 spray into both nostrils daily. Patient not taking: Reported on 12/07/2022 11/04/22   Raspet, Noberto Retort, PA-C  promethazine-dextromethorphan (PROMETHAZINE-DM) 6.25-15 MG/5ML syrup Take 5 mLs by mouth 2 (two) times daily as needed for cough. Patient not taking: Reported on 12/07/2022 11/04/22   Raspet, Noberto Retort, PA-C      Allergies    Patient has no known allergies.    Review of Systems   Review of Systems  Constitutional:  Positive for chills and fatigue. Negative for fever.  HENT:  Positive for congestion and sore throat.   Respiratory:  Positive for cough. Negative for chest tightness, shortness of breath and wheezing.   Cardiovascular:  Negative for chest pain and palpitations.  Gastrointestinal:  Positive for nausea. Negative for abdominal pain, constipation, diarrhea and vomiting.  Genitourinary:  Negative for dysuria.  Musculoskeletal:  Negative for back pain, neck pain and neck stiffness.  Skin:  Negative for rash.  Neurological:  Negative for headaches.  Psychiatric/Behavioral:  Negative for agitation.      Physical Exam Updated Vital Signs BP 115/85 (BP Location: Right Arm)   Pulse 63   Temp 98.2 F (36.8 C) (Oral)   Resp 16   LMP 12/10/2022   SpO2 100%  Physical Exam Vitals and nursing note reviewed.  Constitutional:      General: She is not in acute distress.    Appearance: She is well-developed. She is not ill-appearing, toxic-appearing or diaphoretic.  HENT:     Head: Normocephalic and atraumatic.     Nose: Congestion present.     Mouth/Throat:     Mouth: Mucous membranes are moist.     Pharynx: No oropharyngeal exudate or posterior oropharyngeal erythema.  Eyes:     Extraocular Movements: Extraocular movements intact.     Conjunctiva/sclera: Conjunctivae normal.     Pupils: Pupils are equal, round, and reactive to light.  Cardiovascular:     Rate and Rhythm: Normal rate and regular rhythm.     Pulses: Normal pulses.     Heart sounds: No murmur heard. Pulmonary:     Effort: Pulmonary effort is normal. No respiratory distress.     Breath sounds: Normal breath sounds. No wheezing, rhonchi or rales.  Chest:     Chest wall: No tenderness.  Abdominal:     General: Abdomen is flat.     Palpations: Abdomen is soft.     Tenderness: There is no abdominal tenderness. There is no guarding or rebound.  Musculoskeletal:        General: No swelling or tenderness.     Cervical  back: Neck supple.  Skin:    General: Skin is warm and dry.     Capillary Refill: Capillary refill takes less than 2 seconds.     Findings: No erythema.  Neurological:     Mental Status: She is alert.  Psychiatric:        Mood and Affect: Mood normal.     ED Results / Procedures / Treatments   Labs (all labs ordered are listed, but only abnormal results are displayed) Labs Reviewed  RESP PANEL BY RT-PCR (RSV, FLU A&B, COVID)  RVPGX2    EKG None  Radiology No results found.  Procedures Procedures    Medications Ordered in ED Medications - No data to display  ED Course/ Medical  Decision Making/ A&P                                 Medical Decision Making   Candace Barnes is a 32 y.o. female migraines, seizures, and previous appendectomy last 6 days she has had chills, congestion, cough,.  She reports that she was around her mother who was diagnosed with COVID after their interaction.  Patient says that her symptoms have been mild to moderate but wanted to get checked out.  She reports this is day 6 she has had nausea and vomiting with the pregnancy and has been similar.  She denies any chest pain or abdominal pain.  Denies any shortness of breath itself.  Denies any trauma.  Reports no other GI or GU symptoms.  She some soreness with her other congestion.  On exam, lungs were clear.  Chest nontender.  Abdomen nontender.  Good bowel sounds.  Back and flanks nontender.  Moist mucous membranes.  Patient otherwise well-appearing with reassuring vital signs on arrival.  We had a shared decision-making conversation and agreed to get a COVID swab for her.  Given her clear breath sounds and pregnancy we agreed to hold on chest x-ray.  Her reassuring vital signs and lack of significant evidence of dehydration we agreed to hold on other blood labs initially.  Patient agrees, anticipate reassessment after swab.  12:49 PM Viral testing is negative.  Suspect either viral infection that is not COVID versus COVID and a negative test but either way I do want her to rest, stay hydrated, and isolate.  She requested some nausea medicine so we will give prescription for nausea meds and she can decide to fill them or not.   Patient will follow-up with her OB/GYN and PCP.  Patient discharged in good condition.         Final Clinical Impression(s) / ED Diagnoses Final diagnoses:  Upper respiratory tract infection, unspecified type  Nausea     Clinical Impression: 1. Upper respiratory tract infection, unspecified type   2. Nausea     Disposition: Discharge  Condition:  Good  I have discussed the results, Dx and Tx plan with the pt(& family if present). He/she/they expressed understanding and agree(s) with the plan. Discharge instructions discussed at great length. Strict return precautions discussed and pt &/or family have verbalized understanding of the instructions. No further questions at time of discharge.    New Prescriptions   DOXYLAMINE-PYRIDOXINE 10-10 MG TBEC    Take 2 tablets by mouth at bedtime.   ONDANSETRON (ZOFRAN-ODT) 4 MG DISINTEGRATING TABLET    Take 1 tablet (4 mg total) by mouth every 8 (eight) hours as needed for nausea or vomiting.  Follow Up: your OBYGN and PCP        Mazal Ebey, Canary Brim, MD 02/09/23 1256

## 2023-02-10 ENCOUNTER — Inpatient Hospital Stay (HOSPITAL_COMMUNITY): Payer: Medicaid Other

## 2023-02-10 ENCOUNTER — Inpatient Hospital Stay (HOSPITAL_COMMUNITY)
Admission: AD | Admit: 2023-02-10 | Discharge: 2023-02-10 | Disposition: A | Discharge status: Home / Self Care | Payer: Medicaid Other | Attending: Obstetrics & Gynecology | Admitting: Obstetrics & Gynecology

## 2023-02-10 DIAGNOSIS — O209 Hemorrhage in early pregnancy, unspecified: Secondary | ICD-10-CM

## 2023-02-10 DIAGNOSIS — Z3490 Encounter for supervision of normal pregnancy, unspecified, unspecified trimester: Secondary | ICD-10-CM

## 2023-02-10 DIAGNOSIS — Z3A08 8 weeks gestation of pregnancy: Secondary | ICD-10-CM | POA: Diagnosis not present

## 2023-02-10 DIAGNOSIS — O23591 Infection of other part of genital tract in pregnancy, first trimester: Secondary | ICD-10-CM | POA: Diagnosis not present

## 2023-02-10 DIAGNOSIS — B9689 Other specified bacterial agents as the cause of diseases classified elsewhere: Secondary | ICD-10-CM | POA: Diagnosis not present

## 2023-02-10 LAB — URINALYSIS, ROUTINE W REFLEX MICROSCOPIC
Bilirubin Urine: NEGATIVE
Glucose, UA: NEGATIVE mg/dL
Hgb urine dipstick: NEGATIVE
Ketones, ur: NEGATIVE mg/dL
Leukocytes,Ua: NEGATIVE
Nitrite: NEGATIVE
Protein, ur: NEGATIVE mg/dL
Specific Gravity, Urine: 1.021 (ref 1.005–1.030)
pH: 5 (ref 5.0–8.0)

## 2023-02-10 LAB — WET PREP, GENITAL
Sperm: NONE SEEN
Trich, Wet Prep: NONE SEEN
WBC, Wet Prep HPF POC: <10 (ref <10)
Yeast Wet Prep HPF POC: NONE SEEN

## 2023-02-10 MED ORDER — PRENATAL 28-0.8 MG PO TABS
1.0000 | ORAL_TABLET | Freq: Every day | ORAL | 12 refills | Status: DC
Start: 2023-02-10 — End: 2024-01-08

## 2023-02-10 MED ORDER — METRONIDAZOLE 500 MG PO TABS: 500.0000 mg | ORAL_TABLET | Freq: Two times a day (BID) | ORAL | 0 refills | Status: AC

## 2023-02-10 NOTE — MAU Note (Signed)
.  Candace Barnes is a 32 y.o. at [redacted]w[redacted]d here in MAU reporting: had some pink spotting today when wiping reports mild cramping as well. (Was unable to go to f/u U/S appoint last month due to her job. ) LMP:  Onset of complaint: today Pain score: 2 Vitals:   02/10/23 1954  BP: 114/73  Pulse: 67  Resp: 18  Temp: 98.8 F (37.1 C)     FHT:n/a Lab orders placed from triage:  u/a vag swabs

## 2023-02-10 NOTE — Discharge Instructions (Signed)
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
## 2023-02-11 LAB — RPR: RPR Ser Ql: NONREACTIVE

## 2023-02-13 LAB — GC/CHLAMYDIA PROBE AMP (~~LOC~~) NOT AT ARMC
Chlamydia: NEGATIVE
Comment: NEGATIVE
Comment: NORMAL
Neisseria Gonorrhea: NEGATIVE

## 2023-03-02 ENCOUNTER — Other Ambulatory Visit: Payer: Self-pay

## 2023-03-02 ENCOUNTER — Emergency Department (HOSPITAL_COMMUNITY)
Admission: EM | Admit: 2023-03-02 | Discharge: 2023-03-03 | Disposition: A | Discharge status: Home / Self Care | Payer: Medicaid Other | Attending: Emergency Medicine | Admitting: Emergency Medicine

## 2023-03-02 ENCOUNTER — Encounter (HOSPITAL_COMMUNITY): Payer: Self-pay | Admitting: Emergency Medicine

## 2023-03-02 DIAGNOSIS — R0981 Nasal congestion: Secondary | ICD-10-CM | POA: Diagnosis present

## 2023-03-02 DIAGNOSIS — J069 Acute upper respiratory infection, unspecified: Secondary | ICD-10-CM | POA: Diagnosis not present

## 2023-03-02 DIAGNOSIS — Z20822 Contact with and (suspected) exposure to covid-19: Secondary | ICD-10-CM | POA: Insufficient documentation

## 2023-03-02 LAB — RESP PANEL BY RT-PCR (RSV, FLU A&B, COVID)  RVPGX2
Influenza A by PCR: NEGATIVE
Influenza B by PCR: NEGATIVE
Resp Syncytial Virus by PCR: NEGATIVE
SARS Coronavirus 2 by RT PCR: NEGATIVE

## 2023-03-02 MED ORDER — ACETAMINOPHEN 325 MG PO TABS
650.0000 mg | ORAL_TABLET | Freq: Once | ORAL | Status: AC | PRN
  Administered 2023-03-02: 650 mg via ORAL
  Filled 2023-03-02: qty 2

## 2023-03-02 NOTE — ED Triage Notes (Signed)
Reports exposure to COVID yesterday at work. Reports congestion, headache, cough, fatigue, febrile in triage. Patient is [redacted] weeks pregnant.

## 2023-03-03 NOTE — ED Provider Notes (Signed)
[place] EMERGENCY DEPARTMENT AT [facility] Provider Note   CSN: [id number] Arrival date & time: 03/02/23  2157     History  Chief Complaint  Patient presents with   Nasal Congestion    Candace Barnes is a 32 y.o. female.  Patient with past medical history significant for migraines, depression, insufficient prenatal care presents to the emergency department complaining of congestion, cough, fevers at home.  She states he is been ongoing for 1 day.  She reports an exposure to COVID at work yesterday.  The patient does report that she is [redacted] weeks pregnant and has not started prenatal care.  She denies chest pain, shortness of breath, urinary symptoms, abdominal pain, nausea, vomiting.  HPI     Home Medications Prior to Admission medications   Medication Sig Start Date End Date Taking? Authorizing Provider  Doxylamine-Pyridoxine 10-10 MG TBEC Take 2 tablets by mouth at bedtime. 02/09/23   Tegeler, Canary Brim, MD  ondansetron (ZOFRAN-ODT) 4 MG disintegrating tablet Take 1 tablet (4 mg total) by mouth every 8 (eight) hours as needed for nausea or vomiting. 02/09/23   Tegeler, Canary Brim, MD  Prenatal 28-0.8 MG TABS Take 1 tablet by mouth daily. 02/10/23   Tereso Newcomer, MD      Allergies    Patient has no known allergies.    Review of Systems   Review of Systems  Physical Exam Updated Vital Signs BP 111/73 (BP Location: Right Arm)   Pulse 76   Temp (!) 100.8 F (38.2 C) (Oral)   Resp 16   Ht 6' (1.829 m)   Wt 56 kg   LMP 12/10/2022   SpO2 98%   BMI 16.74 kg/m  Physical Exam Vitals and nursing note reviewed.  Constitutional:      General: She is not in acute distress.    Appearance: She is well-developed.  HENT:     Head: Normocephalic and atraumatic.     Nose: No congestion.     Mouth/Throat:     Mouth: Mucous membranes are moist.  Eyes:     Conjunctiva/sclera: Conjunctivae normal.  Cardiovascular:     Rate and Rhythm: Normal rate and regular  rhythm.  Pulmonary:     Effort: Pulmonary effort is normal. No respiratory distress.     Breath sounds: Normal breath sounds.  Abdominal:     Palpations: Abdomen is soft.     Tenderness: There is no abdominal tenderness.  Musculoskeletal:        General: No swelling.     Cervical back: Neck supple.  Skin:    General: Skin is warm and dry.     Capillary Refill: Capillary refill takes less than 2 seconds.  Neurological:     Mental Status: She is alert.  Psychiatric:        Mood and Affect: Mood normal.     ED Results / Procedures / Treatments   Labs (all labs ordered are listed, but only abnormal results are displayed) Labs Reviewed  RESP PANEL BY RT-PCR (RSV, FLU A&B, COVID)  RVPGX2    EKG None  Radiology No results found.  Procedures Procedures    Medications Ordered in ED Medications  acetaminophen (TYLENOL) tablet 650 mg (650 mg Oral Given 03/02/23 2217)    ED Course/ Medical Decision Making/ A&P  Medical Decision Making Risk OTC drugs.   This patient presents to the ED for concern of upper respiratory symptoms, this involves an extensive number of treatment options, and is a complaint that carries with it a high risk of complications and morbidity.  The differential diagnosis includes COVID, influenza, other respiratory viral infections, pneumonia, others   Lab Tests:  I Ordered, and personally interpreted labs.  The pertinent results include: Negative respiratory panel   Imaging Studies ordered:  The patient is lungs are clear to auscultation bilaterally and I see no indication at this time for imaging   Problem List / ED Course / Critical interventions / Medication management   I ordered medication including Tylenol for fever Reevaluation of the patient after these medicines showed that the patient improved I have reviewed the patients home medicines and have made adjustments as needed   Social Determinants of  Health:  Patient is Medicaid for primary health insurance type   Test / Admission - Considered:  Patient with negative respiratory panel and symptoms consistent with an upper respiratory infection.  She denies any respiratory distress or any other life-threatening symptoms at this time.  Plan to discharge home with recommendations for Tylenol and strong recommendations for follow-up with OB/GYN to begin prenatal care.  Patient voices understanding with importance of prenatal care and understands supportive care for her viral respiratory symptoms.  Return precautions provided.  Discharged home.         Final Clinical Impression(s) / ED Diagnoses Final diagnoses:  Upper respiratory tract infection, unspecified type    Rx / DC Orders ED Discharge Orders     None         Pamala Duffel 03/03/23 0102    Tilden Fossa, MD 03/03/23 [phone]

## 2023-03-03 NOTE — Discharge Instructions (Addendum)
Your evaluated today for symptoms consistent with an upper respiratory infection.  Your tests were negative for COVID, influenza, and RSV.  Please take Tylenol, up to 1000 mg every 8 hours for fever and headache relief.  It is extremely important that you see an OB/GYN to begin prenatal care.  If you develop any life-threatening symptoms please return to the emergency department.

## 2023-03-12 ENCOUNTER — Other Ambulatory Visit: Payer: Self-pay

## 2023-03-12 ENCOUNTER — Inpatient Hospital Stay (HOSPITAL_COMMUNITY)
Admission: AD | Admit: 2023-03-12 | Discharge: 2023-03-13 | Disposition: A | Discharge status: Home / Self Care | Payer: Medicaid Other | Attending: Obstetrics and Gynecology | Admitting: Obstetrics and Gynecology

## 2023-03-12 DIAGNOSIS — R110 Nausea: Secondary | ICD-10-CM | POA: Insufficient documentation

## 2023-03-12 DIAGNOSIS — O26891 Other specified pregnancy related conditions, first trimester: Secondary | ICD-10-CM | POA: Insufficient documentation

## 2023-03-12 DIAGNOSIS — O26899 Other specified pregnancy related conditions, unspecified trimester: Secondary | ICD-10-CM

## 2023-03-12 DIAGNOSIS — Z3A13 13 weeks gestation of pregnancy: Secondary | ICD-10-CM | POA: Insufficient documentation

## 2023-03-12 NOTE — MAU Note (Signed)
.  Candace Barnes is a 32 y.o. at [redacted]w[redacted]d here in MAU reporting: wasn't a planned pregnancy and has not been able to start prenatal care yet due to not being able to find a practice that accepts her insurance, but she is here today reporting nausea, fatigue, and weakness. Denies emesis, pain, VB, or abnormal discharge.   Onset of complaint: pt states this has been ongoing since finding out she was pregnant.  Pain score: 0 Vitals:   03/12/23 2324  BP: 109/64  Pulse: 91  Resp: 16  Temp: 98.4 F (36.9 C)  SpO2: 99%     FHT:156 Lab orders placed from triage:  UA

## 2023-03-13 ENCOUNTER — Encounter (HOSPITAL_COMMUNITY): Payer: Self-pay | Admitting: Obstetrics and Gynecology

## 2023-03-13 DIAGNOSIS — Z3A13 13 weeks gestation of pregnancy: Secondary | ICD-10-CM

## 2023-03-13 DIAGNOSIS — R110 Nausea: Secondary | ICD-10-CM

## 2023-03-13 DIAGNOSIS — O26891 Other specified pregnancy related conditions, first trimester: Secondary | ICD-10-CM

## 2023-03-13 LAB — URINALYSIS, ROUTINE W REFLEX MICROSCOPIC
Bilirubin Urine: NEGATIVE
Glucose, UA: NEGATIVE mg/dL
Hgb urine dipstick: NEGATIVE
Ketones, ur: NEGATIVE mg/dL
Leukocytes,Ua: NEGATIVE
Nitrite: NEGATIVE
Protein, ur: NEGATIVE mg/dL
Specific Gravity, Urine: 1.03 (ref 1.005–1.030)
pH: 5 (ref 5.0–8.0)

## 2023-03-13 MED ORDER — PROMETHAZINE HCL 25 MG PO TABS: 25.0000 mg | ORAL_TABLET | Freq: Four times a day (QID) | ORAL | 0 refills | Status: DC | PRN

## 2023-03-13 MED ORDER — ONDANSETRON 4 MG PO TBDP
8.0000 mg | ORAL_TABLET | Freq: Once | ORAL | Status: AC
  Administered 2023-03-13: 8 mg via ORAL
  Filled 2023-03-13: qty 2

## 2023-03-13 MED ORDER — PROCHLORPERAZINE MALEATE 10 MG PO TABS
10.0000 mg | ORAL_TABLET | Freq: Two times a day (BID) | ORAL | 0 refills | Status: DC | PRN
Start: 2023-03-13 — End: 2023-06-20

## 2023-03-13 MED ORDER — ONDANSETRON 8 MG PO TBDP: 8.0000 mg | ORAL_TABLET | Freq: Three times a day (TID) | ORAL | 0 refills | Status: DC | PRN
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
## 2023-03-13 NOTE — Discharge Instructions (Addendum)
[facility] [employer] for [employer] at [employer] [facility]             [address], [place], [place] [address] [phone]  Center for [employer] at [facility]                                                             [address], [place], [place], [address] [phone]  Center for [facility] at [facility] [address], [place], [place], [address] [phone]  Center for [facility] at [facility] [address], [place], [place], [address] [phone]  Center for [facility] at [facility]                                 [address] [place], [place], [place], [address] [phone]  Center for [facility] at [facility]                                    [address], [place], [place], [address] [phone]  Center for [facility] at [facility] [address], [place], [place], [address]                              [facility] [place] [address], [place], [place], [address] [phone]  [facility]         Phone: [phone]  [facility]      Phone: [phone]   [facility]      Phone: [phone]  [facility]         Phone: [phone]   [facility]    Phone: [phone]  [facility]      Phone: [phone]  [facility]     Phone: [phone]   Safe Medications in Pregnancy   Acne: Benzoyl Peroxide Salicylic Acid  Backache/Headache: Tylenol: 2 regular strength every 4 hours OR              2 Extra strength every 6 hours  Colds/Coughs/Allergies: Benadryl (alcohol free) 25 mg every 6 hours as needed Breath right strips Claritin Cepacol throat lozenges Chloraseptic throat  spray Cold-Eeze- up to three times per day Cough drops, alcohol free Flonase (by prescription only) Guaifenesin Mucinex Robitussin DM (plain only, alcohol free) Saline nasal spray/drops Sudafed (pseudoephedrine) & Actifed ** use only after [redacted] weeks gestation and if you do not have high blood pressure Tylenol Vicks Vaporub Zinc lozenges Zyrtec   Constipation: Colace Ducolax suppositories Fleet enema Glycerin suppositories Metamucil Milk of magnesia Miralax Senokot Smooth move tea  Diarrhea: Kaopectate Imodium A-D  *NO pepto Bismol  Hemorrhoids: Anusol Anusol HC Preparation H Tucks  Indigestion: Tums Maalox Mylanta Zantac  Pepcid  Insomnia: Benadryl (alcohol free) 25mg   every 6 hours as needed Tylenol PM Unisom, no Gelcaps  Leg Cramps: Tums MagGel  Nausea/Vomiting:  Bonine Dramamine Emetrol Ginger extract Sea bands Meclizine  Nausea medication to take during pregnancy:  Unisom (doxylamine succinate 25 mg tablets) Take one tablet daily at bedtime. If symptoms are not adequately controlled, the dose can be increased to a maximum recommended dose of two tablets daily (1/2 tablet in the morning, 1/2 tablet mid-afternoon and one at bedtime). Vitamin B6 100mg  tablets. Take one tablet twice a day (up to 200 mg per day).  Skin Rashes: Aveeno products Benadryl cream or 25mg  every 6 hours as needed Calamine Lotion 1% cortisone cream  Yeast infection: Gyne-lotrimin 7 Monistat 7   **If taking multiple medications, please check labels to avoid duplicating the same active ingredients **take medication as directed on the label ** Do not exceed 4000 mg of tylenol in 24 hours **Do not take medications that contain aspirin or ibuprofen

## 2023-03-29 ENCOUNTER — Encounter (HOSPITAL_COMMUNITY): Payer: Self-pay | Admitting: *Deleted

## 2023-03-29 ENCOUNTER — Emergency Department (HOSPITAL_COMMUNITY)
Admission: EM | Admit: 2023-03-29 | Discharge: 2023-03-30 | Discharge status: Against Medical Advice | Payer: Medicaid Other | Attending: Emergency Medicine | Admitting: Emergency Medicine

## 2023-03-29 ENCOUNTER — Other Ambulatory Visit: Payer: Self-pay

## 2023-03-29 DIAGNOSIS — J029 Acute pharyngitis, unspecified: Secondary | ICD-10-CM | POA: Diagnosis not present

## 2023-03-29 DIAGNOSIS — Z5321 Procedure and treatment not carried out due to patient leaving prior to being seen by health care provider: Secondary | ICD-10-CM | POA: Insufficient documentation

## 2023-03-29 DIAGNOSIS — O99891 Other specified diseases and conditions complicating pregnancy: Secondary | ICD-10-CM | POA: Insufficient documentation

## 2023-03-29 NOTE — ED Triage Notes (Signed)
The pt is c/o a soret throat for the past 3 days sometimes temp  lmp July 8th 3 months pregnant

## 2023-03-30 LAB — GROUP A STREP BY PCR: Group A Strep by PCR: NOT DETECTED

## 2023-03-30 NOTE — ED Notes (Signed)
Pt did not answer to being called for room

## 2023-04-08 ENCOUNTER — Inpatient Hospital Stay (HOSPITAL_COMMUNITY): Payer: Medicaid Other

## 2023-04-08 ENCOUNTER — Other Ambulatory Visit: Payer: Self-pay

## 2023-04-08 ENCOUNTER — Inpatient Hospital Stay (HOSPITAL_COMMUNITY)
Admission: AD | Admit: 2023-04-08 | Discharge: 2023-04-08 | Disposition: A | Discharge status: Home / Self Care | Payer: Medicaid Other | Attending: Obstetrics and Gynecology | Admitting: Obstetrics and Gynecology

## 2023-04-08 DIAGNOSIS — Z3A17 17 weeks gestation of pregnancy: Secondary | ICD-10-CM

## 2023-04-08 DIAGNOSIS — O26892 Other specified pregnancy related conditions, second trimester: Secondary | ICD-10-CM

## 2023-04-08 DIAGNOSIS — O23592 Infection of other part of genital tract in pregnancy, second trimester: Secondary | ICD-10-CM | POA: Diagnosis not present

## 2023-04-08 DIAGNOSIS — B9689 Other specified bacterial agents as the cause of diseases classified elsewhere: Secondary | ICD-10-CM | POA: Diagnosis not present

## 2023-04-08 DIAGNOSIS — O0932 Supervision of pregnancy with insufficient antenatal care, second trimester: Secondary | ICD-10-CM

## 2023-04-08 DIAGNOSIS — O321XX0 Maternal care for breech presentation, not applicable or unspecified: Secondary | ICD-10-CM | POA: Insufficient documentation

## 2023-04-08 DIAGNOSIS — O2312 Infections of bladder in pregnancy, second trimester: Secondary | ICD-10-CM | POA: Diagnosis not present

## 2023-04-08 DIAGNOSIS — N3000 Acute cystitis without hematuria: Secondary | ICD-10-CM | POA: Insufficient documentation

## 2023-04-08 DIAGNOSIS — N760 Acute vaginitis: Secondary | ICD-10-CM

## 2023-04-08 DIAGNOSIS — R102 Pelvic and perineal pain: Secondary | ICD-10-CM

## 2023-04-08 DIAGNOSIS — R109 Unspecified abdominal pain: Secondary | ICD-10-CM | POA: Insufficient documentation

## 2023-04-08 LAB — URINALYSIS, ROUTINE W REFLEX MICROSCOPIC
Bilirubin Urine: NEGATIVE
Glucose, UA: NEGATIVE mg/dL
Hgb urine dipstick: NEGATIVE
Ketones, ur: NEGATIVE mg/dL
Leukocytes,Ua: NEGATIVE
Nitrite: POSITIVE — AB
Protein, ur: NEGATIVE mg/dL
Specific Gravity, Urine: 1.02 (ref 1.005–1.030)
pH: 6 (ref 5.0–8.0)

## 2023-04-08 LAB — WET PREP, GENITAL
Sperm: NONE SEEN
Trich, Wet Prep: NONE SEEN
WBC, Wet Prep HPF POC: >=10 — AB (ref <10)
Yeast Wet Prep HPF POC: NONE SEEN

## 2023-04-08 MED ORDER — METRONIDAZOLE 500 MG PO TABS: 500.0000 mg | ORAL_TABLET | Freq: Two times a day (BID) | ORAL | 0 refills | Status: AC

## 2023-04-08 MED ORDER — CEPHALEXIN 500 MG PO CAPS: 500.0000 mg | ORAL_CAPSULE | Freq: Two times a day (BID) | ORAL | 0 refills | Status: AC

## 2023-04-08 NOTE — MAU Note (Signed)
Candace Barnes is a 32 y.o. at [redacted]w[redacted]d here in MAU reporting: having constant sharp abdominal pain that begin in upper abdomen and shoots down into lower abdomen.  Denies VB or LOF, endorses white discharge. LMP: NA Onset of complaint: today Pain score: 8 Vitals:   04/08/23 1342  BP: 101/66  Pulse: 77  Resp: 17  Temp: 98.3 F (36.8 C)  SpO2: 100%     FHT: 148 bpm Lab orders placed from triage:   UA
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
## 2023-04-10 ENCOUNTER — Telehealth: Payer: Self-pay | Admitting: Family Medicine

## 2023-04-10 LAB — GC/CHLAMYDIA PROBE AMP (~~LOC~~) NOT AT ARMC
Chlamydia: NEGATIVE
Comment: NEGATIVE
Comment: NORMAL
Neisseria Gonorrhea: NEGATIVE

## 2023-04-10 NOTE — Telephone Encounter (Signed)
Called patients sister to get a good phone number to set up ob appointments. Sister stated that the patients phone is off and she will have patient give our office a call at  a later time.

## 2023-04-11 ENCOUNTER — Telehealth: Payer: Self-pay | Admitting: Family Medicine

## 2023-04-11 NOTE — Telephone Encounter (Signed)
Tried calling patient to setup appt for start of prenatal care but there was no answer or voicemail. Tried reaching 3 times.

## 2023-05-09 ENCOUNTER — Telehealth: Payer: Medicaid Other

## 2023-05-22 ENCOUNTER — Other Ambulatory Visit: Payer: Self-pay

## 2023-05-22 ENCOUNTER — Other Ambulatory Visit (HOSPITAL_COMMUNITY)
Admission: RE | Admit: 2023-05-22 | Discharge: 2023-05-22 | Disposition: A | Discharge status: Home / Self Care | Payer: Medicaid Other | Source: Ambulatory Visit | Attending: Obstetrics and Gynecology | Admitting: Obstetrics and Gynecology

## 2023-05-22 ENCOUNTER — Ambulatory Visit (INDEPENDENT_AMBULATORY_CARE_PROVIDER_SITE_OTHER): Payer: Medicaid Other | Admitting: Family Medicine

## 2023-05-22 VITALS — BP 118/74 | HR 81 | Wt 151.4 lb

## 2023-05-22 DIAGNOSIS — Z3482 Encounter for supervision of other normal pregnancy, second trimester: Secondary | ICD-10-CM

## 2023-05-22 DIAGNOSIS — O0990 Supervision of high risk pregnancy, unspecified, unspecified trimester: Secondary | ICD-10-CM | POA: Insufficient documentation

## 2023-05-22 DIAGNOSIS — G40909 Epilepsy, unspecified, not intractable, without status epilepticus: Secondary | ICD-10-CM

## 2023-05-22 DIAGNOSIS — O0992 Supervision of high risk pregnancy, unspecified, second trimester: Secondary | ICD-10-CM

## 2023-05-22 DIAGNOSIS — Z3A23 23 weeks gestation of pregnancy: Secondary | ICD-10-CM | POA: Diagnosis not present

## 2023-05-22 DIAGNOSIS — O99352 Diseases of the nervous system complicating pregnancy, second trimester: Secondary | ICD-10-CM | POA: Diagnosis not present

## 2023-05-22 DIAGNOSIS — Z23 Encounter for immunization: Secondary | ICD-10-CM

## 2023-05-22 MED ORDER — LEVETIRACETAM 500 MG PO TABS: 500.0000 mg | ORAL_TABLET | Freq: Two times a day (BID) | ORAL | 3 refills | Status: DC
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
## 2023-05-23 ENCOUNTER — Encounter: Payer: Self-pay | Admitting: Family Medicine

## 2023-05-23 ENCOUNTER — Encounter: Payer: Self-pay | Admitting: *Deleted

## 2023-05-23 LAB — CERVICOVAGINAL ANCILLARY ONLY
Bacterial Vaginitis (gardnerella): POSITIVE — AB
Candida Glabrata: NEGATIVE
Candida Vaginitis: NEGATIVE
Chlamydia: NEGATIVE
Comment: NEGATIVE
Comment: NEGATIVE
Comment: NEGATIVE
Comment: NEGATIVE
Comment: NEGATIVE
Comment: NORMAL
Neisseria Gonorrhea: NEGATIVE
Trichomonas: NEGATIVE

## 2023-05-23 LAB — CYTOLOGY - PAP
Chlamydia: NEGATIVE
Comment: NEGATIVE
Comment: NEGATIVE
Comment: NEGATIVE
Comment: NORMAL
Diagnosis: NEGATIVE
High risk HPV: NEGATIVE
Neisseria Gonorrhea: NEGATIVE
Trichomonas: NEGATIVE

## 2023-05-23 LAB — CBC/D/PLT+RPR+RH+ABO+RUBIGG...
Antibody Screen: NEGATIVE
Basophils Absolute: 0 10*3/uL (ref 0.0–0.2)
Basos: 0 %
EOS (ABSOLUTE): 0 10*3/uL (ref 0.0–0.4)
Eos: 1 %
HCV Ab: NONREACTIVE
HIV Screen 4th Generation wRfx: NONREACTIVE
Hematocrit: 31.1 % — ABNORMAL LOW (ref 34.0–46.6)
Hemoglobin: 10 g/dL — ABNORMAL LOW (ref 11.1–15.9)
Hepatitis B Surface Ag: NEGATIVE
Immature Grans (Abs): 0 10*3/uL (ref 0.0–0.1)
Immature Granulocytes: 1 %
Lymphocytes Absolute: 1.2 10*3/uL (ref 0.7–3.1)
Lymphs: 24 %
MCH: 22 pg — ABNORMAL LOW (ref 26.6–33.0)
MCHC: 32.2 g/dL (ref 31.5–35.7)
MCV: 69 fL — ABNORMAL LOW (ref 79–97)
Monocytes Absolute: 0.4 10*3/uL (ref 0.1–0.9)
Monocytes: 8 %
Neutrophils Absolute: 3.3 10*3/uL (ref 1.4–7.0)
Neutrophils: 66 %
Platelets: 303 10*3/uL (ref 150–450)
RBC: 4.54 x10E6/uL (ref 3.77–5.28)
RDW: 18 % — ABNORMAL HIGH (ref 11.7–15.4)
RPR Ser Ql: NONREACTIVE
Rh Factor: POSITIVE
Rubella Antibodies, IGG: 1.36 {index} (ref >0.99)
WBC: 4.9 10*3/uL (ref 3.4–10.8)

## 2023-05-23 LAB — HEMOGLOBIN A1C
Est. average glucose Bld gHb Est-mCnc: 100 mg/dL
Hgb A1c MFr Bld: 5.1 % (ref 4.8–5.6)

## 2023-05-23 LAB — HCV INTERPRETATION

## 2023-05-23 MED ORDER — METRONIDAZOLE 500 MG PO TABS
500.0000 mg | ORAL_TABLET | Freq: Two times a day (BID) | ORAL | 0 refills | Status: DC
Start: 2023-05-23 — End: 2023-06-20

## 2023-05-23 NOTE — Addendum Note (Signed)
Addended by: Celedonio Savage on: 05/23/2023 02:09 PM   Modules accepted: Orders

## 2023-05-24 LAB — URINE CULTURE, OB REFLEX

## 2023-05-24 LAB — CULTURE, OB URINE

## 2023-05-26 ENCOUNTER — Encounter: Payer: Self-pay | Admitting: Neurology

## 2023-05-28 LAB — PANORAMA PRENATAL TEST FULL PANEL:PANORAMA TEST PLUS 5 ADDITIONAL MICRODELETIONS: FETAL FRACTION: 10.7

## 2023-06-19 ENCOUNTER — Other Ambulatory Visit: Payer: Self-pay | Admitting: *Deleted

## 2023-06-19 DIAGNOSIS — R569 Unspecified convulsions: Secondary | ICD-10-CM

## 2023-06-19 DIAGNOSIS — O0990 Supervision of high risk pregnancy, unspecified, unspecified trimester: Secondary | ICD-10-CM

## 2023-06-19 DIAGNOSIS — G40909 Epilepsy, unspecified, not intractable, without status epilepticus: Secondary | ICD-10-CM

## 2023-06-19 DIAGNOSIS — D352 Benign neoplasm of pituitary gland: Secondary | ICD-10-CM

## 2023-06-20 ENCOUNTER — Ambulatory Visit: Payer: Medicaid Other | Attending: Obstetrics and Gynecology

## 2023-06-20 ENCOUNTER — Other Ambulatory Visit: Payer: Medicaid Other

## 2023-06-20 ENCOUNTER — Other Ambulatory Visit: Payer: Self-pay

## 2023-06-20 ENCOUNTER — Ambulatory Visit: Payer: Medicaid Other | Admitting: Obstetrics and Gynecology

## 2023-06-20 ENCOUNTER — Ambulatory Visit: Payer: Medicaid Other | Admitting: *Deleted

## 2023-06-20 ENCOUNTER — Encounter: Payer: Self-pay | Admitting: Family Medicine

## 2023-06-20 VITALS — BP 102/63

## 2023-06-20 VITALS — BP 126/65 | HR 74 | Wt 154.2 lb

## 2023-06-20 DIAGNOSIS — Z3A27 27 weeks gestation of pregnancy: Secondary | ICD-10-CM

## 2023-06-20 DIAGNOSIS — Z23 Encounter for immunization: Secondary | ICD-10-CM

## 2023-06-20 DIAGNOSIS — O0990 Supervision of high risk pregnancy, unspecified, unspecified trimester: Secondary | ICD-10-CM | POA: Diagnosis present

## 2023-06-20 DIAGNOSIS — G40909 Epilepsy, unspecified, not intractable, without status epilepticus: Secondary | ICD-10-CM

## 2023-06-20 DIAGNOSIS — O99893 Other specified diseases and conditions complicating puerperium: Secondary | ICD-10-CM | POA: Diagnosis not present

## 2023-06-20 DIAGNOSIS — D353 Benign neoplasm of craniopharyngeal duct: Secondary | ICD-10-CM | POA: Diagnosis present

## 2023-06-20 DIAGNOSIS — O99012 Anemia complicating pregnancy, second trimester: Secondary | ICD-10-CM | POA: Diagnosis not present

## 2023-06-20 DIAGNOSIS — Z3009 Encounter for other general counseling and advice on contraception: Secondary | ICD-10-CM

## 2023-06-20 DIAGNOSIS — O99350 Diseases of the nervous system complicating pregnancy, unspecified trimester: Secondary | ICD-10-CM | POA: Diagnosis present

## 2023-06-20 DIAGNOSIS — D352 Benign neoplasm of pituitary gland: Secondary | ICD-10-CM

## 2023-06-20 DIAGNOSIS — O99352 Diseases of the nervous system complicating pregnancy, second trimester: Secondary | ICD-10-CM

## 2023-06-20 DIAGNOSIS — O0992 Supervision of high risk pregnancy, unspecified, second trimester: Secondary | ICD-10-CM | POA: Diagnosis not present

## 2023-06-20 MED ORDER — FERROUS SULFATE 325 (65 FE) MG PO TABS: 325.0000 mg | ORAL_TABLET | Freq: Every day | ORAL | 1 refills | Status: DC

## 2023-06-20 NOTE — Progress Notes (Signed)
   PRENATAL VISIT NOTE  Subjective:  Candace Barnes is a 33 y.o. G2P1001 at [redacted]w[redacted]d being seen today for ongoing prenatal care.  She is currently monitored for the following issues for this high-risk pregnancy and has PITUITARY ADENOMA, BENIGN; Depression; Migraine; Seizure (HCC); Seizure disorder during pregnancy, antepartum (HCC); Postpartum hemorrhage; and Supervision of high risk pregnancy, antepartum on their problem list.  Patient reports  episodes of lightheadedness, fatigue, nausea  .  Contractions: Not present. Vag. Bleeding: None.  Movement: Present. Denies leaking of fluid.   The following portions of the patient's history were reviewed and updated as appropriate: allergies, current medications, past family history, past medical history, past social history, past surgical history and problem list.   Objective:   Vitals:   06/20/23 0836  BP: 126/65  Pulse: 74  Weight: 154 lb 3.2 oz (69.9 kg)    Fetal Status: Fetal Heart Rate (bpm): 142   Movement: Present     General:  Alert, oriented and cooperative. Patient is in no acute distress.  Skin: Skin is warm and dry. No rash noted.   Cardiovascular: Normal heart rate noted  Respiratory: Normal respiratory effort, no problems with respiration noted  Abdomen: Soft, gravid, appropriate for gestational age.  Pain/Pressure: Present     Pelvic: Cervical exam deferred        Extremities: Normal range of motion.  Edema: None  Mental Status: Normal mood and affect. Normal behavior. Normal judgment and thought content.   Assessment and Plan:  Pregnancy: G2P1001 at [redacted]w[redacted]d 1. Supervision of high risk pregnancy, antepartum (Primary) BP and FHR normal Doing well, feeling regular movement   2. Seizure disorder during pregnancy, antepartum [facility]) Not taking the keppra , last seizure in the last 6 months, would like to wait until she sees Neurology on 1/28  3. Anemia affecting pregnancy in second trimester Rx sent to pharmacy to start every other  day - ferrous sulfate  325 (65 FE) MG tablet; Take 1 tablet (325 mg total) by mouth daily with breakfast.  Dispense: 90 tablet; Refill: 1  4. [redacted] weeks gestation of pregnancy Tdap given today  GTT and labs today  Discussed small frequent meals/snacks throughout the day, precautions provided to follow up  Would like BTL for contraception, consent signed today  - Tdap vaccine greater than or equal to 7yo IM  5. PITUITARY ADENOMA, BENIGN Not followed for this, has not been seen in years, last imaging in our system 2016 that was normal, no symptoms   Preterm labor symptoms and general obstetric precautions including but not limited to vaginal bleeding, contractions, leaking of fluid and fetal movement were reviewed in detail with the patient. Please refer to After Visit Summary for other counseling recommendations.   Return in two weeks for routine prenatal   Future Appointments  Date Time Provider Department Center  06/20/2023  9:15 AM Delores Nidia CROME, FNP [facility]  07/04/2023  9:00 AM Georjean Darice HERO, MD [facility] None  07/18/2023  9:15 AM Delores Nidia CROME, FNP [facility]    Nidia Delores, FNP

## 2023-06-21 LAB — CBC
Hematocrit: 32.9 % — ABNORMAL LOW (ref 34.0–46.6)
Hemoglobin: 9.9 g/dL — ABNORMAL LOW (ref 11.1–15.9)
MCH: 20.7 pg — ABNORMAL LOW (ref 26.6–33.0)
MCHC: 30.1 g/dL — ABNORMAL LOW (ref 31.5–35.7)
MCV: 69 fL — ABNORMAL LOW (ref 79–97)
Platelets: 297 10*3/uL (ref 150–450)
RBC: 4.78 x10E6/uL (ref 3.77–5.28)
RDW: 18.1 % — ABNORMAL HIGH (ref 11.7–15.4)
WBC: 6.7 10*3/uL (ref 3.4–10.8)

## 2023-06-21 LAB — GLUCOSE TOLERANCE, 2 HOURS W/ 1HR
Glucose, 1 hour: 94 mg/dL (ref 70–179)
Glucose, 2 hour: 101 mg/dL (ref 70–152)
Glucose, Fasting: 74 mg/dL (ref 70–91)

## 2023-06-21 LAB — RPR: RPR Ser Ql: NONREACTIVE

## 2023-06-21 LAB — HIV ANTIBODY (ROUTINE TESTING W REFLEX): HIV Screen 4th Generation wRfx: NONREACTIVE

## 2023-07-04 ENCOUNTER — Encounter: Payer: Self-pay | Admitting: Neurology

## 2023-07-04 ENCOUNTER — Ambulatory Visit (INDEPENDENT_AMBULATORY_CARE_PROVIDER_SITE_OTHER): Payer: Medicaid Other | Admitting: Neurology

## 2023-07-04 VITALS — BP 134/78

## 2023-07-04 DIAGNOSIS — G40909 Epilepsy, unspecified, not intractable, without status epilepticus: Secondary | ICD-10-CM

## 2023-07-04 MED ORDER — LEVETIRACETAM 500 MG PO TABS: 500.0000 mg | ORAL_TABLET | Freq: Two times a day (BID) | ORAL | 11 refills | Status: AC
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
## 2023-07-07 ENCOUNTER — Encounter (HOSPITAL_COMMUNITY): Payer: Self-pay | Admitting: Obstetrics and Gynecology

## 2023-07-07 ENCOUNTER — Inpatient Hospital Stay (HOSPITAL_COMMUNITY): Payer: Medicaid Other

## 2023-07-07 ENCOUNTER — Encounter: Payer: Self-pay | Admitting: Neurology

## 2023-07-07 ENCOUNTER — Other Ambulatory Visit: Payer: Medicaid Other

## 2023-07-07 ENCOUNTER — Inpatient Hospital Stay (HOSPITAL_COMMUNITY)
Admission: AD | Admit: 2023-07-07 | Discharge: 2023-07-07 | Disposition: A | Discharge status: Home / Self Care | Payer: Medicaid Other | Attending: Obstetrics and Gynecology | Admitting: Obstetrics and Gynecology

## 2023-07-07 DIAGNOSIS — W010XXA Fall on same level from slipping, tripping and stumbling without subsequent striking against object, initial encounter: Secondary | ICD-10-CM | POA: Insufficient documentation

## 2023-07-07 DIAGNOSIS — O9A213 Injury, poisoning and certain other consequences of external causes complicating pregnancy, third trimester: Secondary | ICD-10-CM | POA: Insufficient documentation

## 2023-07-07 DIAGNOSIS — O26893 Other specified pregnancy related conditions, third trimester: Secondary | ICD-10-CM

## 2023-07-07 DIAGNOSIS — Z3689 Encounter for other specified antenatal screening: Secondary | ICD-10-CM | POA: Diagnosis present

## 2023-07-07 DIAGNOSIS — Y990 Civilian activity done for income or pay: Secondary | ICD-10-CM | POA: Diagnosis not present

## 2023-07-07 DIAGNOSIS — Z3A29 29 weeks gestation of pregnancy: Secondary | ICD-10-CM | POA: Diagnosis not present

## 2023-07-07 DIAGNOSIS — G40909 Epilepsy, unspecified, not intractable, without status epilepticus: Secondary | ICD-10-CM | POA: Insufficient documentation

## 2023-07-07 DIAGNOSIS — W19XXXA Unspecified fall, initial encounter: Secondary | ICD-10-CM

## 2023-07-07 DIAGNOSIS — O99353 Diseases of the nervous system complicating pregnancy, third trimester: Secondary | ICD-10-CM | POA: Diagnosis not present

## 2023-07-07 LAB — URINALYSIS, ROUTINE W REFLEX MICROSCOPIC
Bilirubin Urine: NEGATIVE
Glucose, UA: NEGATIVE mg/dL
Hgb urine dipstick: NEGATIVE
Ketones, ur: NEGATIVE mg/dL
Leukocytes,Ua: NEGATIVE
Nitrite: NEGATIVE
Protein, ur: NEGATIVE mg/dL
Specific Gravity, Urine: 1.02 (ref 1.005–1.030)
pH: 7 (ref 5.0–8.0)

## 2023-07-07 NOTE — MAU Note (Signed)
.  Candace Barnes is a 33 y.o. at [redacted]w[redacted]d here in MAU reporting: Slipped and fell at work in the restroom due to a puddle on the floor at 0530 this morning. She reports she came down on her left side and hit her left arm, left side of her abdomen, and left leg. Has had a HA all day, prior to fall. She denies VB or LOF. Reports fetal movement but reports it has been decreased since the incident.   Reports her GM is aware and is working on the necessary paperwork.  Onset of complaint: 0530 Pain scores: 5/10 HA 5/10 left posterior forearm 5/10 left calf  Vitals:   07/07/23 1135  BP: (!) 107/59  Pulse: 71  Resp: 16  Temp: 98.4 F (36.9 C)  SpO2: 98%      FHT: 135 initial external Lab orders placed from triage: UA, Fetal movement clicker given

## 2023-07-07 NOTE — MAU Note (Signed)
Patient reports feeling normal and vigorous fetal movement.
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
## 2023-07-18 ENCOUNTER — Ambulatory Visit (INDEPENDENT_AMBULATORY_CARE_PROVIDER_SITE_OTHER): Payer: Medicaid Other | Admitting: Obstetrics and Gynecology

## 2023-07-18 ENCOUNTER — Other Ambulatory Visit: Payer: Self-pay

## 2023-07-18 ENCOUNTER — Encounter: Payer: Self-pay | Admitting: Family Medicine

## 2023-07-18 VITALS — BP 111/72 | HR 115 | Wt 160.2 lb

## 2023-07-18 DIAGNOSIS — O0990 Supervision of high risk pregnancy, unspecified, unspecified trimester: Secondary | ICD-10-CM

## 2023-07-18 DIAGNOSIS — O99013 Anemia complicating pregnancy, third trimester: Secondary | ICD-10-CM | POA: Diagnosis not present

## 2023-07-18 DIAGNOSIS — O0993 Supervision of high risk pregnancy, unspecified, third trimester: Secondary | ICD-10-CM

## 2023-07-18 DIAGNOSIS — O99353 Diseases of the nervous system complicating pregnancy, third trimester: Secondary | ICD-10-CM | POA: Diagnosis not present

## 2023-07-18 DIAGNOSIS — G40909 Epilepsy, unspecified, not intractable, without status epilepticus: Secondary | ICD-10-CM

## 2023-07-18 DIAGNOSIS — Z3A31 31 weeks gestation of pregnancy: Secondary | ICD-10-CM

## 2023-07-18 DIAGNOSIS — O99012 Anemia complicating pregnancy, second trimester: Secondary | ICD-10-CM

## 2023-07-18 NOTE — Progress Notes (Signed)
   PRENATAL VISIT NOTE  Subjective:  Candace Barnes is a 33 y.o. G2P1001 at [redacted]w[redacted]d being seen today for ongoing prenatal care.  She is currently monitored for the following issues for this low-risk pregnancy and has PITUITARY ADENOMA, BENIGN; Depression; Migraine; Seizure (HCC); Seizure disorder during pregnancy, antepartum (HCC); Postpartum hemorrhage; Supervision of high risk pregnancy, antepartum; and Unwanted fertility on their problem list.  Patient reports fatigue.  Contractions: Irritability. Vag. Bleeding: None.  Movement: Present. Denies leaking of fluid.   The following portions of the patient's history were reviewed and updated as appropriate: allergies, current medications, past family history, past medical history, past social history, past surgical history and problem list.   Objective:   Vitals:   07/18/23 0946  BP: 111/72  Pulse: (!) 115  Weight: 160 lb 3.2 oz (72.7 kg)    Fetal Status: Fetal Heart Rate (bpm): 155   Movement: Present     General:  Alert, oriented and cooperative. Patient is in no acute distress.  Skin: Skin is warm and dry. No rash noted.   Cardiovascular: Normal heart rate noted  Respiratory: Normal respiratory effort, no problems with respiration noted  Abdomen: Soft, gravid, appropriate for gestational age.  Pain/Pressure: Absent     Pelvic: Cervical exam deferred        Extremities: Normal range of motion.  Edema: None  Mental Status: Normal mood and affect. Normal behavior. Normal judgment and thought content.   Assessment and Plan:  Pregnancy: G2P1001 at [redacted]w[redacted]d 1. Supervision of high risk pregnancy, antepartum (Primary) BP and FHR normal Doing well, feeling regular movement   - [place] MFM OB FOLLOW UP; Future  2. Seizure disorder during pregnancy, antepartum (HCC) Seen by neuro on 1/28 who recommended Keppra 500mg  BID, has not started, missed her EEG. Would encourage to reschedule EEG and start meds d/t increase risk of having seizure during  pregnancy Has follow up with neuro 3/20  3. Anemia affecting pregnancy in second trimester Rechecking blood work today, taking oral iron  WIll assess need for infusion  4. [redacted] weeks gestation of pregnancy Encouraged adequate hydration and nutrition    Preterm labor symptoms and general obstetric precautions including but not limited to vaginal bleeding, contractions, leaking of fluid and fetal movement were reviewed in detail with the patient. Please refer to After Visit Summary for other counseling recommendations.   Return in about 2 weeks (around 08/01/2023), or schedule with midwife.  Future Appointments  Date Time Provider Department Center  07/20/2023  7:30 AM [facility] US3 [facility]  08/01/2023  2:35 PM [facility]  08/24/2023 10:30 AM Van Clines, MD [facility] None    Albertine Grates, FNP

## 2023-07-18 NOTE — Patient Instructions (Signed)
[email or web address]

## 2023-07-19 ENCOUNTER — Other Ambulatory Visit: Payer: Self-pay | Admitting: Obstetrics and Gynecology

## 2023-07-19 ENCOUNTER — Telehealth: Payer: Self-pay

## 2023-07-19 DIAGNOSIS — O99012 Anemia complicating pregnancy, second trimester: Secondary | ICD-10-CM

## 2023-07-19 DIAGNOSIS — O99019 Anemia complicating pregnancy, unspecified trimester: Secondary | ICD-10-CM | POA: Insufficient documentation

## 2023-07-19 LAB — CBC
Hematocrit: 29.9 % — ABNORMAL LOW (ref 34.0–46.6)
Hemoglobin: 9.7 g/dL — ABNORMAL LOW (ref 11.1–15.9)
MCH: 21.4 pg — ABNORMAL LOW (ref 26.6–33.0)
MCHC: 32.4 g/dL (ref 31.5–35.7)
MCV: 66 fL — ABNORMAL LOW (ref 79–97)
Platelets: 244 10*3/uL (ref 150–450)
RBC: 4.54 x10E6/uL (ref 3.77–5.28)
RDW: 19.4 % — ABNORMAL HIGH (ref 11.7–15.4)
WBC: 6.7 10*3/uL (ref 3.4–10.8)

## 2023-07-19 LAB — IRON AND TIBC
Iron Saturation: 11 % — ABNORMAL LOW (ref 15–55)
Iron: 49 ug/dL (ref 27–159)
Total Iron Binding Capacity: 438 ug/dL (ref 250–450)
UIBC: 389 ug/dL (ref 131–425)

## 2023-07-19 LAB — FERRITIN: Ferritin: 27 ng/mL (ref 15–150)

## 2023-07-19 LAB — FOLATE: Folate: 14.3 ng/mL (ref >3.0)

## 2023-07-19 NOTE — Telephone Encounter (Signed)
[name], patient will be scheduled as soon as possible.  Auth Submission: NO AUTH NEEDED Site of care: Site of care: [facility] Payer: [employer] medicaid Medication & CPT/J Code(s) submitted: Venofer (Iron Sucrose) J1756 Route of submission (phone, fax, portal):  Phone # Fax # Auth type: Buy/Bill PB Units/visits requested: 200mg  x 2 doses Reference number:  Approval from: 07/19/23 to 01/16/24

## 2023-07-20 ENCOUNTER — Ambulatory Visit: Payer: Medicaid Other

## 2023-07-24 ENCOUNTER — Ambulatory Visit: Payer: Medicaid Other

## 2023-07-24 VITALS — BP 96/58 | HR 74 | Temp 98.3°F | Resp 18 | Ht 72.0 in | Wt 158.8 lb

## 2023-07-24 DIAGNOSIS — D508 Other iron deficiency anemias: Secondary | ICD-10-CM

## 2023-07-24 DIAGNOSIS — O99012 Anemia complicating pregnancy, second trimester: Secondary | ICD-10-CM

## 2023-07-24 DIAGNOSIS — O99013 Anemia complicating pregnancy, third trimester: Secondary | ICD-10-CM | POA: Diagnosis not present

## 2023-07-24 DIAGNOSIS — Z3A32 32 weeks gestation of pregnancy: Secondary | ICD-10-CM

## 2023-07-24 MED ORDER — ACETAMINOPHEN 325 MG PO TABS
650.0000 mg | ORAL_TABLET | Freq: Once | ORAL | Status: AC
  Administered 2023-07-24: 650 mg via ORAL
  Filled 2023-07-24: qty 2

## 2023-07-24 MED ORDER — DIPHENHYDRAMINE HCL 25 MG PO CAPS
25.0000 mg | ORAL_CAPSULE | Freq: Once | ORAL | Status: AC
  Administered 2023-07-24: 25 mg via ORAL
  Filled 2023-07-24: qty 1

## 2023-07-24 MED ORDER — IRON SUCROSE 20 MG/ML IV SOLN
200.0000 mg | Freq: Once | INTRAVENOUS | Status: AC
  Administered 2023-07-24: 200 mg via INTRAVENOUS
  Filled 2023-07-24: qty 10

## 2023-07-24 NOTE — Progress Notes (Signed)
Diagnosis: Acute Anemia  Provider:  Chilton Greathouse MD  Procedure: IV Push  IV Type: Peripheral, IV Location: R Forearm  Venofer (Iron Sucrose), Dose: 200 mg  Post Infusion IV Care: Observation period completed and Peripheral IV Discontinued  Discharge: Condition: Good, Destination: Home . AVS Provided  Performed by:  Nat Math, RN

## 2023-07-28 ENCOUNTER — Encounter (HOSPITAL_COMMUNITY): Payer: Self-pay | Admitting: Obstetrics & Gynecology

## 2023-07-28 ENCOUNTER — Other Ambulatory Visit: Payer: Self-pay

## 2023-07-28 ENCOUNTER — Inpatient Hospital Stay (HOSPITAL_COMMUNITY)
Admission: AD | Admit: 2023-07-28 | Discharge: 2023-07-28 | Disposition: A | Discharge status: Home / Self Care | Payer: Medicaid Other | Attending: Obstetrics & Gynecology | Admitting: Obstetrics & Gynecology

## 2023-07-28 DIAGNOSIS — R55 Syncope and collapse: Secondary | ICD-10-CM | POA: Insufficient documentation

## 2023-07-28 DIAGNOSIS — R002 Palpitations: Secondary | ICD-10-CM | POA: Insufficient documentation

## 2023-07-28 DIAGNOSIS — O99013 Anemia complicating pregnancy, third trimester: Secondary | ICD-10-CM | POA: Insufficient documentation

## 2023-07-28 DIAGNOSIS — O26893 Other specified pregnancy related conditions, third trimester: Secondary | ICD-10-CM | POA: Insufficient documentation

## 2023-07-28 DIAGNOSIS — Z3A32 32 weeks gestation of pregnancy: Secondary | ICD-10-CM | POA: Insufficient documentation

## 2023-07-28 LAB — CBC
HCT: 31.1 % — ABNORMAL LOW (ref 36.0–46.0)
Hemoglobin: 9.9 g/dL — ABNORMAL LOW (ref 12.0–15.0)
MCH: 21.5 pg — ABNORMAL LOW (ref 26.0–34.0)
MCHC: 31.8 g/dL (ref 30.0–36.0)
MCV: 67.5 fL — ABNORMAL LOW (ref 80.0–100.0)
Platelets: 249 10*3/uL (ref 150–400)
RBC: 4.61 MIL/uL (ref 3.87–5.11)
RDW: 19.8 % — ABNORMAL HIGH (ref 11.5–15.5)
WBC: 8.4 10*3/uL (ref 4.0–10.5)
nRBC: 0 % (ref 0.0–0.2)

## 2023-07-28 LAB — URINALYSIS, ROUTINE W REFLEX MICROSCOPIC
Bilirubin Urine: NEGATIVE
Glucose, UA: NEGATIVE mg/dL
Hgb urine dipstick: NEGATIVE
Ketones, ur: NEGATIVE mg/dL
Nitrite: NEGATIVE
Protein, ur: NEGATIVE mg/dL
Specific Gravity, Urine: 1.019 (ref 1.005–1.030)
pH: 6 (ref 5.0–8.0)

## 2023-07-28 LAB — COMPREHENSIVE METABOLIC PANEL
ALT: 25 U/L (ref 0–44)
AST: 30 U/L (ref 15–41)
Albumin: 2.9 g/dL — ABNORMAL LOW (ref 3.5–5.0)
Alkaline Phosphatase: 54 U/L (ref 38–126)
Anion gap: 8 (ref 5–15)
BUN: 8 mg/dL (ref 6–20)
CO2: 26 mmol/L (ref 22–32)
Calcium: 8.6 mg/dL — ABNORMAL LOW (ref 8.9–10.3)
Chloride: 100 mmol/L (ref 98–111)
Creatinine, Ser: 0.58 mg/dL (ref 0.44–1.00)
GFR, Estimated: >60 mL/min (ref >60)
Glucose, Bld: 93 mg/dL (ref 70–99)
Potassium: 4 mmol/L (ref 3.5–5.1)
Sodium: 134 mmol/L — ABNORMAL LOW (ref 135–145)
Total Bilirubin: 0.8 mg/dL (ref 0.0–1.2)
Total Protein: 6.7 g/dL (ref 6.5–8.1)

## 2023-07-28 NOTE — Discharge Instructions (Signed)
Increase PO Hydration and rest  F/U in office - return if symptoms do not resolve or worsen

## 2023-07-28 NOTE — MAU Note (Signed)
Candace Barnes is a 33 y.o. at [redacted]w[redacted]d here in MAU reporting: she reports at 9am this morning at work she had an episode of near syncope, she reports blurred vision, reports her hands turned "blue and purple" at that time, and she also reports she felt a sharp pain at the top of her abdomen and on her left side of her abdomen at that time. Denies pain currently. She reports her last iron transfusion was on Monday. Reports her baby isn't moving as much today. Denies VB and LOF.   Onset of complaint: 9 am Pain score: denies Vitals:   07/28/23 1612  BP: 108/70  Pulse: 84  Resp: 16  Temp: 99 F (37.2 C)  SpO2: 99%     FHT: 137  Lab orders placed from triage: urine collected
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
## 2023-07-31 ENCOUNTER — Ambulatory Visit (INDEPENDENT_AMBULATORY_CARE_PROVIDER_SITE_OTHER): Payer: Medicaid Other

## 2023-07-31 VITALS — BP 102/66 | HR 75 | Temp 98.9°F | Resp 16 | Ht 72.0 in | Wt 160.0 lb

## 2023-07-31 DIAGNOSIS — Z3A33 33 weeks gestation of pregnancy: Secondary | ICD-10-CM | POA: Diagnosis not present

## 2023-07-31 DIAGNOSIS — D508 Other iron deficiency anemias: Secondary | ICD-10-CM | POA: Diagnosis not present

## 2023-07-31 DIAGNOSIS — O99013 Anemia complicating pregnancy, third trimester: Secondary | ICD-10-CM

## 2023-07-31 DIAGNOSIS — O99012 Anemia complicating pregnancy, second trimester: Secondary | ICD-10-CM

## 2023-07-31 MED ORDER — DIPHENHYDRAMINE HCL 25 MG PO CAPS
25.0000 mg | ORAL_CAPSULE | Freq: Once | ORAL | Status: AC
  Administered 2023-07-31: 25 mg via ORAL
  Filled 2023-07-31: qty 1

## 2023-07-31 MED ORDER — IRON SUCROSE 20 MG/ML IV SOLN
200.0000 mg | Freq: Once | INTRAVENOUS | Status: AC
  Administered 2023-07-31: 200 mg via INTRAVENOUS
  Filled 2023-07-31: qty 10

## 2023-07-31 MED ORDER — ACETAMINOPHEN 325 MG PO TABS
650.0000 mg | ORAL_TABLET | Freq: Once | ORAL | Status: AC
  Administered 2023-07-31: 650 mg via ORAL
  Filled 2023-07-31: qty 2

## 2023-07-31 NOTE — Progress Notes (Signed)
 Diagnosis: Anemia during pregnancy     Provider:  Chilton Greathouse MD  Procedure: IV Push  IV Type: Peripheral, IV Location: R Forearm  Venofer (Iron Sucrose), Dose: 200 mg  Post Infusion IV Care: Observation period completed and Peripheral IV Discontinued  Discharge: Condition: Good, Destination: Home . AVS Declined  Performed by:  Marlow Baars [place]tt, RN

## 2023-08-01 ENCOUNTER — Encounter: Payer: Medicaid Other | Admitting: Certified Nurse Midwife

## 2023-08-02 ENCOUNTER — Other Ambulatory Visit: Payer: Self-pay

## 2023-08-02 ENCOUNTER — Ambulatory Visit: Payer: Medicaid Other | Attending: Obstetrics and Gynecology

## 2023-08-02 DIAGNOSIS — O0990 Supervision of high risk pregnancy, unspecified, unspecified trimester: Secondary | ICD-10-CM | POA: Insufficient documentation

## 2023-08-02 DIAGNOSIS — G40909 Epilepsy, unspecified, not intractable, without status epilepticus: Secondary | ICD-10-CM

## 2023-08-02 DIAGNOSIS — O99353 Diseases of the nervous system complicating pregnancy, third trimester: Secondary | ICD-10-CM | POA: Diagnosis not present

## 2023-08-02 DIAGNOSIS — Z3A33 33 weeks gestation of pregnancy: Secondary | ICD-10-CM | POA: Diagnosis not present

## 2023-08-03 ENCOUNTER — Other Ambulatory Visit: Payer: Self-pay

## 2023-08-03 DIAGNOSIS — G40909 Epilepsy, unspecified, not intractable, without status epilepticus: Secondary | ICD-10-CM

## 2023-08-04 ENCOUNTER — Other Ambulatory Visit: Payer: Self-pay | Admitting: Nurse Practitioner

## 2023-08-04 ENCOUNTER — Encounter: Payer: Self-pay | Admitting: Nurse Practitioner

## 2023-08-04 DIAGNOSIS — R55 Syncope and collapse: Secondary | ICD-10-CM

## 2023-08-04 DIAGNOSIS — Z3A33 33 weeks gestation of pregnancy: Secondary | ICD-10-CM

## 2023-08-11 ENCOUNTER — Ambulatory Visit (INDEPENDENT_AMBULATORY_CARE_PROVIDER_SITE_OTHER): Admitting: Cardiology

## 2023-08-11 ENCOUNTER — Encounter: Payer: Self-pay | Admitting: Cardiology

## 2023-08-11 ENCOUNTER — Ambulatory Visit: Attending: Cardiology

## 2023-08-11 VITALS — BP 100/68 | HR 65 | Ht 72.0 in | Wt 161.2 lb

## 2023-08-11 DIAGNOSIS — Z87898 Personal history of other specified conditions: Secondary | ICD-10-CM | POA: Diagnosis not present

## 2023-08-11 DIAGNOSIS — R42 Dizziness and giddiness: Secondary | ICD-10-CM | POA: Diagnosis not present

## 2023-08-11 DIAGNOSIS — R55 Syncope and collapse: Secondary | ICD-10-CM

## 2023-08-11 NOTE — Progress Notes (Unsigned)
 Enrolled patient for a 7 day Zio XT monitor to be mailed to patients home.

## 2023-08-11 NOTE — Patient Instructions (Addendum)
 Medication Instructions:   Your physician recommends that you continue on your current medications as directed. Please refer to the Current Medication list given to you today.   *If you need a refill on your cardiac medications before your next appointment, please call your pharmacy*   Lab Work: None    If you have labs (blood work) drawn today and your tests are completely normal, you will receive your results only by: MyChart Message (if you have MyChart) OR A paper copy in the mail If you have any lab test that is abnormal or we need to change your treatment, we will call you to review the results.   Testing/Procedures: Echo- OB will be scheduled at [facility].  Your physician has requested that you have an echocardiogram. Echocardiography is a painless test that uses sound waves to create images of your heart. It provides your doctor with information about the size and shape of your heart and how well your heart's chambers and valves are working. This procedure takes approximately one hour. There are no restrictions for this procedure. Please do NOT wear cologne, perfume, aftershave, or lotions (deodorant is allowed). Please arrive 15 minutes prior to your appointment time. ZIO XT- Long Term Monitor Instructions  Your physician has requested you wear a ZIO patch monitor for 7 days.  This is a single patch monitor. [employer] supplies one patch monitor per enrollment. Additional stickers are not available. Please do not apply patch if you will be having a Nuclear Stress Test,  Echocardiogram, Cardiac CT, MRI, or Chest Xray during the period you would be wearing the  monitor. The patch cannot be worn during these tests. You cannot remove and re-apply the  ZIO XT patch monitor.  Your ZIO patch monitor will be mailed 3 day USPS to your address on file. It may take 3-5 days  to receive your monitor after you have been enrolled.  Once you have received your monitor, please  review the enclosed instructions. Your monitor  has already been registered assigning a specific monitor serial # to you.  Billing and Patient Assistance Program Information  We have supplied [employer] with any of your insurance information on file for billing purposes. [employer] offers a sliding scale Patient Assistance Program for patients that do not have  insurance, or whose insurance does not completely cover the cost of the ZIO monitor.  You must apply for the Patient Assistance Program to qualify for this discounted rate.  To apply, please call [employer] at [phone], select option 4, select option 2, ask to apply for  Patient Assistance Program. Meredeth Ide will ask your household income, and how many people  are in your household. They will quote your out-of-pocket cost based on that information.  [employer] will also be able to set up a 21-month, interest-free payment plan if needed.  Applying the monitor   Shave hair from upper left chest.  Hold abrader disc by orange tab. Rub abrader in 40 strokes over the upper left chest as  indicated in your monitor instructions.  Clean area with 4 enclosed alcohol pads. Let dry.  Apply patch as indicated in monitor instructions. Patch will be placed under collarbone on left  side of chest with arrow pointing upward.  Rub patch adhesive wings for 2 minutes. Remove white label marked "1". Remove the white  label marked "2". Rub patch adhesive wings for 2 additional minutes.  While looking in a mirror, press and release button in center of  patch. A small green light will  flash 3-4 times. This will be your only indicator that the monitor has been turned on.  Do not shower for the first 24 hours. You may shower after the first 24 hours.  Press the button if you feel a symptom. You will hear a small click. Record Date, Time and  Symptom in the Patient Logbook.  When you are ready to remove the patch, follow instructions on the last 2 pages of Patient   Logbook. Stick patch monitor onto the last page of Patient Logbook.  Place Patient Logbook in the blue and white box. Use locking tab on box and tape box closed  securely. The blue and white box has prepaid postage on it. Please place it in the mailbox as  soon as possible. Your physician should have your test results approximately 7 days after the  monitor has been mailed back to [facility].  Call [facility] Customer Care at [phone] if you have questions regarding  your ZIO XT patch monitor. Call them immediately if you see an orange light blinking on your  monitor.  If your monitor falls off in less than 4 days, contact our Monitor department at [phone].  If your monitor becomes loose or falls off after 4 days call [employer] at [phone] for  suggestions on securing your monitor     Follow-Up: At [facility], you and your health needs are our priority.  As part of our continuing mission to provide you with exceptional heart care, we have created designated Provider Care Teams.  These Care Teams include your primary Cardiologist (physician) and Advanced Practice Providers (APPs -  Physician Assistants and Nurse Practitioners) who all work together to provide you with the care you need, when you need it.  We recommend signing up for the patient portal called "MyChart".  Sign up information is provided on this After Visit Summary.  MyChart is used to connect with patients for Virtual Visits (Telemedicine).  Patients are able to view lab/test results, encounter notes, upcoming appointments, etc.  Non-urgent messages can be sent to your provider as well.   To learn more about what you can do with MyChart, go to [email or web address].    Your next appointment:   8 week(s)  The format for your next appointment:   In Person  Provider:   DR. Thomasene Ripple    Other Instructions
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
## 2023-08-21 ENCOUNTER — Encounter (HOSPITAL_COMMUNITY): Payer: Self-pay | Admitting: Obstetrics and Gynecology

## 2023-08-21 ENCOUNTER — Inpatient Hospital Stay (HOSPITAL_COMMUNITY)
Admission: AD | Admit: 2023-08-21 | Discharge: 2023-08-21 | Disposition: A | Discharge status: Home / Self Care | Attending: Obstetrics and Gynecology | Admitting: Obstetrics and Gynecology

## 2023-08-21 ENCOUNTER — Other Ambulatory Visit: Payer: Self-pay

## 2023-08-21 DIAGNOSIS — Z3A36 36 weeks gestation of pregnancy: Secondary | ICD-10-CM | POA: Diagnosis not present

## 2023-08-21 DIAGNOSIS — O0990 Supervision of high risk pregnancy, unspecified, unspecified trimester: Secondary | ICD-10-CM

## 2023-08-21 DIAGNOSIS — O4703 False labor before 37 completed weeks of gestation, third trimester: Secondary | ICD-10-CM | POA: Insufficient documentation

## 2023-08-21 NOTE — MAU Note (Signed)
 Candace Barnes is a 33 y.o. at [redacted]w[redacted]d here in MAU reporting: she's began having ctxs this afternoon @ 1330, states the ctxs are mild and 1-3 minutes apart.  Denies VB or LOF.  Endorses +FM.  LMP: NA Onset of complaint: today Pain score: 6 Vitals:   08/21/23 1447  BP: 116/70  Pulse: 83  Resp: 18  Temp: 98.8 F (37.1 C)  SpO2: 100%     FHT: 132 bpm  Lab orders placed from triage: None

## 2023-08-24 ENCOUNTER — Encounter: Payer: Self-pay | Admitting: Neurology

## 2023-08-24 ENCOUNTER — Ambulatory Visit: Payer: Medicaid Other | Admitting: Neurology

## 2023-08-30 ENCOUNTER — Ambulatory Visit: Payer: Medicaid Other | Attending: Obstetrics and Gynecology

## 2023-08-30 DIAGNOSIS — O99353 Diseases of the nervous system complicating pregnancy, third trimester: Secondary | ICD-10-CM

## 2023-08-30 DIAGNOSIS — O99891 Other specified diseases and conditions complicating pregnancy: Secondary | ICD-10-CM

## 2023-08-30 DIAGNOSIS — D352 Benign neoplasm of pituitary gland: Secondary | ICD-10-CM

## 2023-08-30 DIAGNOSIS — O99350 Diseases of the nervous system complicating pregnancy, unspecified trimester: Secondary | ICD-10-CM | POA: Diagnosis present

## 2023-08-30 DIAGNOSIS — G40909 Epilepsy, unspecified, not intractable, without status epilepticus: Secondary | ICD-10-CM | POA: Diagnosis present

## 2023-08-30 DIAGNOSIS — Z3A37 37 weeks gestation of pregnancy: Secondary | ICD-10-CM

## 2023-09-05 ENCOUNTER — Ambulatory Visit (HOSPITAL_COMMUNITY): Attending: Cardiology

## 2023-09-06 ENCOUNTER — Encounter (HOSPITAL_COMMUNITY): Payer: Self-pay | Admitting: Cardiology

## 2023-09-12 DIAGNOSIS — R55 Syncope and collapse: Secondary | ICD-10-CM

## 2023-09-12 DIAGNOSIS — R42 Dizziness and giddiness: Secondary | ICD-10-CM

## 2023-09-20 ENCOUNTER — Encounter (HOSPITAL_COMMUNITY): Payer: Self-pay

## 2023-09-20 ENCOUNTER — Ambulatory Visit (HOSPITAL_COMMUNITY)
Admission: EM | Admit: 2023-09-20 | Discharge: 2023-09-20 | Disposition: A | Discharge status: Home / Self Care | Attending: Family Medicine | Admitting: Family Medicine

## 2023-09-20 DIAGNOSIS — K0889 Other specified disorders of teeth and supporting structures: Secondary | ICD-10-CM

## 2023-09-20 MED ORDER — AMOXICILLIN-POT CLAVULANATE 875-125 MG PO TABS: 1.0000 | ORAL_TABLET | Freq: Two times a day (BID) | ORAL | 0 refills | Status: DC

## 2023-09-20 MED ORDER — HYDROCODONE-ACETAMINOPHEN 5-325 MG PO TABS: 1.0000 | ORAL_TABLET | Freq: Four times a day (QID) | ORAL | 0 refills | Status: DC | PRN

## 2023-09-20 NOTE — ED Triage Notes (Signed)
 Patient with headache and right sided mouth pain for 3 days. Patient states it is hard to chew, talk, or move her mouth.

## 2023-09-20 NOTE — Discharge Instructions (Signed)
 Be aware, you have been prescribed pain medications that may cause drowsiness. While taking this medication, do not take any other medications containing acetaminophen (Tylenol). Do not combine with alcohol or recreational drugs. Please do not drive, operate heavy machinery, or take part in activities that require making important decisions while on this medication as your judgement may be clouded.
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
## 2023-10-10 ENCOUNTER — Ambulatory Visit: Attending: Cardiology | Admitting: Cardiology

## 2023-10-10 ENCOUNTER — Emergency Department (HOSPITAL_COMMUNITY)
Admission: EM | Admit: 2023-10-10 | Discharge: 2023-10-11 | Discharge status: Against Medical Advice | Attending: Emergency Medicine | Admitting: Emergency Medicine

## 2023-10-10 ENCOUNTER — Emergency Department (HOSPITAL_COMMUNITY)

## 2023-10-10 DIAGNOSIS — Z5321 Procedure and treatment not carried out due to patient leaving prior to being seen by health care provider: Secondary | ICD-10-CM | POA: Insufficient documentation

## 2023-10-10 DIAGNOSIS — R519 Headache, unspecified: Secondary | ICD-10-CM | POA: Insufficient documentation

## 2023-10-10 LAB — COMPREHENSIVE METABOLIC PANEL WITH GFR
ALT: 12 U/L (ref 0–44)
AST: 15 U/L (ref 15–41)
Albumin: 3.7 g/dL (ref 3.5–5.0)
Alkaline Phosphatase: 56 U/L (ref 38–126)
Anion gap: 7 (ref 5–15)
BUN: 10 mg/dL (ref 6–20)
CO2: 25 mmol/L (ref 22–32)
Calcium: 9 mg/dL (ref 8.9–10.3)
Chloride: 105 mmol/L (ref 98–111)
Creatinine, Ser: 0.75 mg/dL (ref 0.44–1.00)
GFR, Estimated: >60 mL/min (ref >60)
Glucose, Bld: 93 mg/dL (ref 70–99)
Potassium: 3.6 mmol/L (ref 3.5–5.1)
Sodium: 137 mmol/L (ref 135–145)
Total Bilirubin: 1.2 mg/dL (ref 0.0–1.2)
Total Protein: 7.9 g/dL (ref 6.5–8.1)

## 2023-10-10 LAB — CBC
HCT: 39.5 % (ref 36.0–46.0)
Hemoglobin: 12.1 g/dL (ref 12.0–15.0)
MCH: 20.8 pg — ABNORMAL LOW (ref 26.0–34.0)
MCHC: 30.6 g/dL (ref 30.0–36.0)
MCV: 68 fL — ABNORMAL LOW (ref 80.0–100.0)
Platelets: 247 10*3/uL (ref 150–400)
RBC: 5.81 MIL/uL — ABNORMAL HIGH (ref 3.87–5.11)
RDW: 18.7 % — ABNORMAL HIGH (ref 11.5–15.5)
WBC: 5 10*3/uL (ref 4.0–10.5)
nRBC: 0 % (ref 0.0–0.2)

## 2023-10-10 LAB — HCG, SERUM, QUALITATIVE: Preg, Serum: NEGATIVE

## 2023-10-10 NOTE — ED Triage Notes (Signed)
 PT states she gave birth 09/12/23 and has had horrible headache everyday since then. Pt had vaginal delivery with no complications. PT is not breast feeding. Has been taking ibuprofen  with no relief.

## 2023-10-10 NOTE — ED Provider Triage Note (Signed)
 Emergency Medicine Provider Triage Evaluation Note  Candace Barnes , a 33 y.o. female  was evaluated in triage.  Pt complains of headache. Has had a headache every day since she had an uncomplicated vaginal delivery on 09/12/2023.  She states that she has never had headaches before.  Pain is on the right side of her head.  Denies any vision changes.  No nausea or vomiting.  No numbness or weakness.  Pain is not relieved with regular 800 mg ibuprofens.  She has not seen anyone for these headaches previously.  Review of Systems  Positive:  Negative:   Physical Exam  BP 115/88 (BP Location: Right Arm)   Pulse 71   Temp 98.5 F (36.9 C)   Resp 16   LMP 12/10/2022   SpO2 97%   Breastfeeding No  Gen:   Awake, no distress   Resp:  Normal effort  MSK:   Moves extremities without difficulty  Other:  Well-appearing, no neurologic deficits  Medical Decision Making  Medically screening exam initiated at 9:56 PM.  Appropriate orders placed.  Enijah Filar was informed that the remainder of the evaluation will be completed by another provider, this initial triage assessment does not replace that evaluation, and the importance of remaining in the ED until their evaluation is complete.  BP WNL, work-up initiated   Dearra Myhand A, PA-C 10/10/23 2158

## 2023-10-11 NOTE — ED Notes (Signed)
 Called multiple times for vitals, no response.

## 2023-10-19 ENCOUNTER — Emergency Department (HOSPITAL_COMMUNITY)

## 2023-10-19 ENCOUNTER — Emergency Department (HOSPITAL_COMMUNITY)
Admission: EM | Admit: 2023-10-19 | Discharge: 2023-10-20 | Disposition: A | Discharge status: Home / Self Care | Attending: Emergency Medicine | Admitting: Emergency Medicine

## 2023-10-19 DIAGNOSIS — J028 Acute pharyngitis due to other specified organisms: Secondary | ICD-10-CM | POA: Insufficient documentation

## 2023-10-19 DIAGNOSIS — B9789 Other viral agents as the cause of diseases classified elsewhere: Secondary | ICD-10-CM | POA: Insufficient documentation

## 2023-10-19 DIAGNOSIS — J029 Acute pharyngitis, unspecified: Secondary | ICD-10-CM

## 2023-10-19 LAB — CBC WITH DIFFERENTIAL/PLATELET
Abs Immature Granulocytes: 0.02 10*3/uL (ref 0.00–0.07)
Basophils Absolute: 0 10*3/uL (ref 0.0–0.1)
Basophils Relative: 1 %
Eosinophils Absolute: 0.1 10*3/uL (ref 0.0–0.5)
Eosinophils Relative: 2 %
HCT: 38.2 % (ref 36.0–46.0)
Hemoglobin: 11.8 g/dL — ABNORMAL LOW (ref 12.0–15.0)
Immature Granulocytes: 0 %
Lymphocytes Relative: 36 %
Lymphs Abs: 2.2 10*3/uL (ref 0.7–4.0)
MCH: 21.1 pg — ABNORMAL LOW (ref 26.0–34.0)
MCHC: 30.9 g/dL (ref 30.0–36.0)
MCV: 68.2 fL — ABNORMAL LOW (ref 80.0–100.0)
Monocytes Absolute: 0.4 10*3/uL (ref 0.1–1.0)
Monocytes Relative: 7 %
Neutro Abs: 3.3 10*3/uL (ref 1.7–7.7)
Neutrophils Relative %: 54 %
Platelets: 238 10*3/uL (ref 150–400)
RBC: 5.6 MIL/uL — ABNORMAL HIGH (ref 3.87–5.11)
RDW: 18.3 % — ABNORMAL HIGH (ref 11.5–15.5)
WBC: 6.1 10*3/uL (ref 4.0–10.5)
nRBC: 0 % (ref 0.0–0.2)

## 2023-10-19 LAB — BASIC METABOLIC PANEL WITH GFR
Anion gap: 12 (ref 5–15)
BUN: 9 mg/dL (ref 6–20)
CO2: 21 mmol/L — ABNORMAL LOW (ref 22–32)
Calcium: 9 mg/dL (ref 8.9–10.3)
Chloride: 103 mmol/L (ref 98–111)
Creatinine, Ser: 0.73 mg/dL (ref 0.44–1.00)
GFR, Estimated: >60 mL/min (ref >60)
Glucose, Bld: 84 mg/dL (ref 70–99)
Potassium: 3.8 mmol/L (ref 3.5–5.1)
Sodium: 136 mmol/L (ref 135–145)

## 2023-10-19 LAB — HCG, SERUM, QUALITATIVE: Preg, Serum: NEGATIVE

## 2023-10-19 LAB — I-STAT CHEM 8, ED
BUN: 9 mg/dL (ref 6–20)
Calcium, Ion: 1.13 mmol/L — ABNORMAL LOW (ref 1.15–1.40)
Chloride: 103 mmol/L (ref 98–111)
Creatinine, Ser: 0.8 mg/dL (ref 0.44–1.00)
Glucose, Bld: 81 mg/dL (ref 70–99)
HCT: 39 % (ref 36.0–46.0)
Hemoglobin: 13.3 g/dL (ref 12.0–15.0)
Potassium: 3.9 mmol/L (ref 3.5–5.1)
Sodium: 137 mmol/L (ref 135–145)
TCO2: 22 mmol/L (ref 22–32)

## 2023-10-19 MED ORDER — IOHEXOL 350 MG/ML SOLN
75.0000 mL | Freq: Once | INTRAVENOUS | Status: AC | PRN
Start: 2023-10-19 — End: 2023-10-19
  Administered 2023-10-19: 75 mL via INTRAVENOUS

## 2023-10-19 NOTE — ED Triage Notes (Signed)
 Pt c/o lymph node swelling on her R side of her neck for the last three days. Pt seen recently for similar issue and given abx and pain meds which did not seem to help.

## 2023-10-19 NOTE — ED Provider Triage Note (Signed)
 Emergency Medicine Provider Triage Evaluation Note  Candace Barnes , a 33 y.o. female  was evaluated in triage.  Pt complains of swelling on the right side of her neck.  She can feel it under her tongue.  She had similar symptoms was treated with ibuprofen  hydrocodone .  States the pain has come back hurts to swallow and her swelling has increased in size..  Review of Systems  Positive: Swelling pain Negative: Fever  Physical Exam  BP (!) 139/93   Pulse 95   Temp 98.4 F (36.9 C) (Oral)   Resp 16   LMP 12/10/2022   SpO2 100%  Gen:   Awake, no distress   Resp:  Normal effort  MSK:   Moves extremities without difficulty  Other:    Medical Decision Making  Medically screening exam initiated at 5:54 PM.  Appropriate orders placed.  Candace Barnes was informed that the remainder of the evaluation will be completed by another provider, this initial triage assessment does not replace that evaluation, and the importance of remaining in the ED until their evaluation is complete.     Tama Fails, PA-C 10/19/23 1800

## 2023-10-20 ENCOUNTER — Encounter (HOSPITAL_COMMUNITY): Payer: Self-pay

## 2023-10-20 ENCOUNTER — Other Ambulatory Visit: Payer: Self-pay

## 2023-10-20 LAB — GROUP A STREP BY PCR: Group A Strep by PCR: NOT DETECTED

## 2023-10-20 MED ORDER — DEXAMETHASONE 4 MG PO TABS
6.0000 mg | ORAL_TABLET | Freq: Once | ORAL | Status: AC
  Administered 2023-10-20: 6 mg via ORAL
  Filled 2023-10-20: qty 2
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
## 2023-10-20 NOTE — Discharge Instructions (Signed)
 For pain control you may take 1000 mg of acetaminophen (Tylenol) every 8 hours and/or 600 mg of Ibuprofen (Motrin, Advil, etc.) every 6-8 hours as needed.  Please limit acetaminophen (Tylenol) to 4000 mg and Ibuprofen (Motrin, Advil, etc.) to 2400 mg for a 24hr period. Please note that other over-the-counter medicine may contain acetaminophen or ibuprofen as a component of their ingredients.

## 2024-01-08 ENCOUNTER — Other Ambulatory Visit: Payer: Self-pay

## 2024-01-08 ENCOUNTER — Encounter (HOSPITAL_COMMUNITY): Payer: Self-pay | Admitting: Emergency Medicine

## 2024-01-08 ENCOUNTER — Ambulatory Visit (HOSPITAL_COMMUNITY): Admission: EM | Admit: 2024-01-08 | Discharge: 2024-01-08 | Disposition: A | Discharge status: Home / Self Care

## 2024-01-08 DIAGNOSIS — H61892 Other specified disorders of left external ear: Secondary | ICD-10-CM

## 2024-01-08 DIAGNOSIS — H6122 Impacted cerumen, left ear: Secondary | ICD-10-CM | POA: Diagnosis not present

## 2024-01-08 DIAGNOSIS — H938X2 Other specified disorders of left ear: Secondary | ICD-10-CM | POA: Diagnosis not present

## 2024-01-08 MED ORDER — OFLOXACIN 0.3 % OT SOLN: 5.0000 [drp] | Freq: Two times a day (BID) | OTIC | 0 refills | Status: AC
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
## 2024-01-08 NOTE — ED Triage Notes (Signed)
 Ringing in left ear for 2 weeks.  Patient reports waking with these symptoms.  patient denies having placed any drops in ear or digging in ear

## 2024-01-08 NOTE — Discharge Instructions (Signed)
 We were able to remove most of the wax.  Your ear canal is irritated which is probably because of the irrigation we did to remove the earwax but I would like you to apply ofloxacin  drops twice daily for the next 5 days to ensure that there is no infection of the ear canal.  Keep your ear facing upward and allow the drops to go all the way to the ear canal when you put them in.  You can use Tylenol  or Profen for pain.  If you have any worsening or changing symptoms including severe pain, pain when you move your ear, trouble hearing, drainage from the ear, fever, nausea, vomiting you need to be seen immediately.

## 2024-03-12 ENCOUNTER — Ambulatory Visit (HOSPITAL_COMMUNITY)
Admission: EM | Admit: 2024-03-12 | Discharge: 2024-03-12 | Disposition: A | Discharge status: Home / Self Care | Attending: Emergency Medicine | Admitting: Emergency Medicine

## 2024-03-12 ENCOUNTER — Encounter (HOSPITAL_COMMUNITY): Payer: Self-pay | Admitting: *Deleted

## 2024-03-12 DIAGNOSIS — R051 Acute cough: Secondary | ICD-10-CM | POA: Diagnosis not present

## 2024-03-12 DIAGNOSIS — B9789 Other viral agents as the cause of diseases classified elsewhere: Secondary | ICD-10-CM

## 2024-03-12 DIAGNOSIS — J988 Other specified respiratory disorders: Secondary | ICD-10-CM | POA: Diagnosis not present

## 2024-03-12 LAB — POC COVID19/FLU A&B COMBO
Covid Antigen, POC: NEGATIVE
Influenza A Antigen, POC: NEGATIVE
Influenza B Antigen, POC: NEGATIVE

## 2024-03-12 NOTE — Discharge Instructions (Signed)
 Your COVID and flu testing were both negative today.  I believe your symptoms are likely related to a viral respiratory illness. You can take over-the-counter Mucinex  for cough and congestion as needed. Alternate between 650 mg of Tylenol  and 400 mg of ibuprofen  as needed for any pain or fever. Make sure you are staying hydrated and getting plenty of rest Follow-up with your primary care provider or return here as needed.

## 2024-03-12 NOTE — ED Triage Notes (Signed)
 Pt states she has cough, congestion and decreased sense of smell and taste X 2 days. She has not taken any meds for sx.
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
